# Patient Record
Sex: Female | Born: 1935 | Race: White | Hispanic: No | Smoking: Never smoker
Health system: Southern US, Community
[De-identification: ages and names within clinical notes are randomized; demographics above are authoritative.]

## PROBLEM LIST (undated history)

## (undated) DIAGNOSIS — J40 Bronchitis, not specified as acute or chronic: Secondary | ICD-10-CM

## (undated) DIAGNOSIS — I10 Essential (primary) hypertension: Secondary | ICD-10-CM

## (undated) DIAGNOSIS — J449 Chronic obstructive pulmonary disease, unspecified: Secondary | ICD-10-CM

## (undated) DIAGNOSIS — F0390 Unspecified dementia without behavioral disturbance: Secondary | ICD-10-CM

## (undated) DIAGNOSIS — C801 Malignant (primary) neoplasm, unspecified: Secondary | ICD-10-CM

## (undated) DIAGNOSIS — N390 Urinary tract infection, site not specified: Secondary | ICD-10-CM

## (undated) DIAGNOSIS — E7800 Pure hypercholesterolemia, unspecified: Secondary | ICD-10-CM

## (undated) DIAGNOSIS — I4891 Unspecified atrial fibrillation: Secondary | ICD-10-CM

## (undated) DIAGNOSIS — R011 Cardiac murmur, unspecified: Secondary | ICD-10-CM

## (undated) DIAGNOSIS — J189 Pneumonia, unspecified organism: Secondary | ICD-10-CM

## (undated) HISTORY — PX: COLON RESECTION: SHX5231

## (undated) HISTORY — PX: COLON SURGERY: SHX602

## (undated) HISTORY — PX: CATARACT EXTRACTION: SUR2

---

## 2002-03-29 ENCOUNTER — Encounter: Payer: Self-pay | Admitting: *Deleted

## 2002-03-29 ENCOUNTER — Observation Stay (HOSPITAL_COMMUNITY): Admission: EM | Admit: 2002-03-29 | Discharge: 2002-03-31 | Payer: Self-pay | Admitting: Emergency Medicine

## 2002-04-13 ENCOUNTER — Encounter: Payer: Self-pay | Admitting: Internal Medicine

## 2002-04-13 ENCOUNTER — Inpatient Hospital Stay (HOSPITAL_COMMUNITY): Admission: EM | Admit: 2002-04-13 | Discharge: 2002-04-18 | Payer: Self-pay | Admitting: *Deleted

## 2002-08-31 ENCOUNTER — Encounter: Payer: Self-pay | Admitting: Emergency Medicine

## 2002-08-31 ENCOUNTER — Emergency Department (HOSPITAL_COMMUNITY): Admission: EM | Admit: 2002-08-31 | Discharge: 2002-08-31 | Payer: Self-pay | Admitting: Emergency Medicine

## 2004-01-25 ENCOUNTER — Emergency Department (HOSPITAL_COMMUNITY): Admission: EM | Admit: 2004-01-25 | Discharge: 2004-01-25 | Payer: Self-pay | Admitting: Emergency Medicine

## 2004-01-29 ENCOUNTER — Inpatient Hospital Stay (HOSPITAL_COMMUNITY): Admission: EM | Admit: 2004-01-29 | Discharge: 2004-02-02 | Payer: Self-pay | Admitting: Emergency Medicine

## 2005-01-22 DIAGNOSIS — J40 Bronchitis, not specified as acute or chronic: Secondary | ICD-10-CM

## 2005-01-22 HISTORY — DX: Bronchitis, not specified as acute or chronic: J40

## 2005-08-03 ENCOUNTER — Emergency Department (HOSPITAL_COMMUNITY): Admission: EM | Admit: 2005-08-03 | Discharge: 2005-08-03 | Payer: Self-pay | Admitting: Emergency Medicine

## 2006-06-07 ENCOUNTER — Inpatient Hospital Stay (HOSPITAL_COMMUNITY): Admission: EM | Admit: 2006-06-07 | Discharge: 2006-07-03 | Payer: Self-pay | Admitting: Emergency Medicine

## 2006-06-07 ENCOUNTER — Ambulatory Visit: Payer: Self-pay | Admitting: Pulmonary Disease

## 2006-06-10 ENCOUNTER — Encounter (INDEPENDENT_AMBULATORY_CARE_PROVIDER_SITE_OTHER): Payer: Self-pay | Admitting: Surgery

## 2006-09-10 ENCOUNTER — Ambulatory Visit: Payer: Self-pay | Admitting: Oncology

## 2006-10-16 LAB — BASIC METABOLIC PANEL
CO2: 25 mEq/L (ref 19–32)
Glucose, Bld: 231 mg/dL — ABNORMAL HIGH (ref 70–99)
Potassium: 4.5 mEq/L (ref 3.5–5.3)
Sodium: 138 mEq/L (ref 135–145)

## 2006-10-16 LAB — CBC & DIFF AND RETIC
Eosinophils Absolute: 0 10*3/uL (ref 0.0–0.5)
HCT: 28.5 % — ABNORMAL LOW (ref 34.8–46.6)
IRF: 0.41 — ABNORMAL HIGH (ref 0.130–0.330)
LYMPH%: 14.6 % (ref 14.0–48.0)
MONO#: 0.2 10*3/uL (ref 0.1–0.9)
NEUT#: 2.9 10*3/uL (ref 1.5–6.5)
NEUT%: 78.4 % — ABNORMAL HIGH (ref 39.6–76.8)
Platelets: 255 10*3/uL (ref 145–400)
Retic %: 2.8 % — ABNORMAL HIGH (ref 0.4–2.3)
WBC: 3.7 10*3/uL — ABNORMAL LOW (ref 3.9–10.0)

## 2006-10-21 LAB — IMMUNOFIXATION ELECTROPHORESIS: IgA: 379 mg/dL — ABNORMAL HIGH (ref 68–378)

## 2006-10-21 LAB — FERRITIN: Ferritin: 441 ng/mL — ABNORMAL HIGH (ref 10–291)

## 2006-10-21 LAB — IRON AND TIBC
%SAT: 22 % (ref 20–55)
Iron: 59 ug/dL (ref 42–145)
TIBC: 269 ug/dL (ref 250–470)

## 2006-11-04 ENCOUNTER — Ambulatory Visit: Payer: Self-pay | Admitting: Oncology

## 2009-10-14 ENCOUNTER — Emergency Department (HOSPITAL_COMMUNITY)
Admission: EM | Admit: 2009-10-14 | Discharge: 2009-10-14 | Payer: Self-pay | Source: Home / Self Care | Admitting: Emergency Medicine

## 2010-04-06 LAB — DIFFERENTIAL
Basophils Absolute: 0 10*3/uL (ref 0.0–0.1)
Basophils Relative: 1 % (ref 0–1)
Eosinophils Absolute: 0.1 10*3/uL (ref 0.0–0.7)
Eosinophils Relative: 2 % (ref 0–5)

## 2010-04-06 LAB — PROTIME-INR: Prothrombin Time: 20.6 seconds — ABNORMAL HIGH (ref 11.6–15.2)

## 2010-04-06 LAB — CBC
MCH: 30.3 pg (ref 26.0–34.0)
MCV: 87.6 fL (ref 78.0–100.0)
Platelets: 161 10*3/uL (ref 150–400)
RDW: 15.8 % — ABNORMAL HIGH (ref 11.5–15.5)
WBC: 3.8 10*3/uL — ABNORMAL LOW (ref 4.0–10.5)

## 2010-04-06 LAB — BASIC METABOLIC PANEL
BUN: 24 mg/dL — ABNORMAL HIGH (ref 6–23)
Chloride: 104 mEq/L (ref 96–112)
Creatinine, Ser: 1.32 mg/dL — ABNORMAL HIGH (ref 0.4–1.2)

## 2010-04-06 LAB — BRAIN NATRIURETIC PEPTIDE: Pro B Natriuretic peptide (BNP): 241 pg/mL — ABNORMAL HIGH (ref 0.0–100.0)

## 2010-06-06 NOTE — Discharge Summary (Signed)
NAMEGRACE, VALLEY               ACCOUNT NO.:  [id number]   MEDICAL RECORD NO.:  [id number]          PATIENT TYPE:  INP   LOCATION:  3701                         FACILITY:  [facility]   PHYSICIAN:  Kela Millin, M.D.DATE OF BIRTH:  1910/05/17   DATE OF PROCEDURE:  DATE OF DISCHARGE:                    STAT - MUST CHANGE TO CORRECT WORK TYPE   STAT DISCHARGE SUMMARY   Addendum to the Discharge Summary dictated on June 25, 2006, by Dr. Doristine Counter.   ADDENDUM TO HOSPITAL COURSE BY PROBLEM LIST:  1. E. coli and Klebsiella oxytoca urinary tract infection - the      patient had a urinalysis done on June 03 while in the hospital and      the urinalysis was consistent with a urine infection.  She was      empirically started on Cipro and the urine cultures subsequently      grew Klebsiella oxytoca and E. coli, which were sensitive to Cipro      (only resistant to Cefazolin and Ampicillin).  The patient has been      on Cipro and has remained hemodynamically stable and afebrile with      no leukocytosis.  She will be discharged on oral Cipro to complete      the antibiotic course on July 06, 2006.  2. Probable right renal complex cyst, 5.3 cm - this was first noted on      CT scan done upon admission, and the patient subsequently had a      renal ultrasound as was recommended to further characterize on Jun 13, 2006, and it was read per Radiology as a right-sided renal cyst      without hydronephrosis.  3. Chronic atrial fibrillation - Cardiology followed the patient in      the hospital for rate control, she was kept off the Cardia that she      had been on initially, and her Metoprolol titrated to 75 mg p.o.      b.i.d. and she is to continue this upon discharge.  The patient had      a GI bleed while in the hospital and so the Coumadin was held until      the bleeding had resolved and her hemoglobin remained stable.  I      discussed restarting her Coumadin with  Gastroenterology, and they      stated that the family had indicated that they desired empiric      treatment without endoscopy and so the source of the bleeding was      unclear and recommended that the risks and benefits of restarting      the Coumadin be discussed with the family and if they decided to      have it restarted to do so.  I talked with patient and her      daughter/family and they voiced understanding of the risks and the      benefits, and after thinking about it for a few days they decided      to have the Coumadin restarted and this was done.  Her PT-INR has      been monitored in the hospital, and her INR today at the time of      this dictation is 1.2 and her hemoglobin is stable at 12.1 and 36.      Her Coumadin is to be continued upon discharge with monitoring of      the PT-INR and her dose adjusted as appropriate, her H&H also to be      monitored upon discharge.  The patient is also to be maintained on      a PPI upon discharge.   DISCHARGE MEDICATIONS:  1. Cipro 500 mg p.o. daily through July 06, 2006.  2. Glyburide 5 mg p.o. b.i.d.  3. Metoprolol 75 mg p.o. b.i.d.  4. Sliding scale insulin.  5. Tessalon Perls 100 mg p.o. t.i.d. p.r.n. cough.  6. Dulcolax 10 mg daily p.r.n. constipation.  7. Citrucel one pack p.o. b.i.d.  8. Protonix 40 mg p.o. daily.  9. Tylenol 650 mg p.o. q.4-6h p.r.n.  10.Coumadin 5 mg p.o. daily; PT-INR to be checked the day following      admission to the nursing home and the Coumadin dose to be adjusted      per nursing home physician as appropriate.  11.The patient's Lasix was held during her hospital stay secondary to      worsening of her renal function, she is to be monitored and the      Lasix restarted per nursing home physician when appropriate.  12.Diovan discontinued in the hospital, also Cartia-XT discontinued in      the hospital.   DISCHARGE DIET:  Following a speech therapy reevaluation of the  patient's swallowing,  she was advanced to a dysphagia-2 diet with thin  liquids, no straws, small bites/sips, and the patient to swallow two  times with all bites/sips, and this to be continued at the skilled  nursing facility.   Speech Therapy to follow patient at skilled nursing, also PT and OT.   The patient to follow up with nursing home physician in one to two days.   Follow up with Dr. Duane Lope as scheduled.   Follow up with [facility] - call for appointment.      Kela Millin, M.D.  Electronically Signed     ACV/MEDQ  D:  07/02/2006  T:  07/02/2006  Job:  [id number]   cc:   C. Duane Lope, M.D.

## 2010-06-06 NOTE — H&P (Signed)
[name], [name]:  [id number]   MEDICAL RECORD NO.:  [id number]          PATIENT TYPE:  INP   LOCATION:  1830                         FACILITY:  [facility]   PHYSICIAN:  Andres Shad. Rudean Curt, MD     DATE OF BIRTH:  09-Nov-1910   DATE OF ADMISSION:  06/07/2006  DATE OF DISCHARGE:                              HISTORY & PHYSICAL   STAT HISTORY AND PHYSICAL   CHIEF COMPLAINT:  Vomiting and epigastric pain.   HISTORY OF PRESENT ILLNESS:  Mrs. [name] is a 77 year old woman with a  past medical history notable for hypertension, diabetes mellitus, and a  remote partial colectomy for colon cancer.  She presented to the  emergency department today with pain in her abdomen that began last  night.  This was accompanied by an episode of vomiting of brownish  liquid last night and then again this morning.  The pain was sharp in  nature and mainly affected her right upper quadrant and epigastric area.   REVIEW OF SYSTEMS:  Negative for cough, negative for shortness of  breath, negative for chest pain, negative for radiation, negative for  fever, negative for chills, negative for diarrhea, negative for  diaphoresis.   PAST MEDICAL HISTORY:  1. Type 2 diabetes mellitus.  2. Hypertension.  3. Atrial fibrillation.  4. Colon cancer diagnosed many years ago treated with a partial      colectomy.   HOME MEDICATIONS:  1. Cartia-XT 120 mg daily.  2. Coumadin 5 mg daily.  3. Diovan 160 mg daily.  4. Glyburide 5 mg twice daily.  5. Lasix 40 mg daily.  6. Potassium chloride 10 mEq twice daily.  7. Toprol-XL 25 mg daily.   ALLERGIES:  None.   SOCIAL HISTORY:  The patient has numerous family members in the area.  She does not smoke.  She drinks one cup of coffee a day.  The family is  [place], and she frequently has tomatoes and spicy foods.   PHYSICAL EXAMINATION:  VITAL SIGNS:  Temperature 98.2, heart rate 86 to  120, blood pressure 134/63, respiratory rate 22, oxygen  saturation 94%  on 2 liters of oxygen.  GENERAL APPEARANCE:  The patient was an alert, elderly female, who was  very hard-of-hearing but pleasant and oriented and in no acute distress.  HEENT:  Mucous membranes were moist.  NECK:  There was no JVD.  There was no thyromegaly.  LUNGS:  Clear to auscultation bilaterally.  CARDIOVASCULAR:  Irregular, normal S1 and S2, a soft murmur was heard in  the precordium.  ABDOMEN:  Normal bowel sounds, soft.  There was mild tenderness in the  epigastric area, no organomegaly.  There was a firm abdominal wall  hernia in the left lower quadrant that the family states is not new.  EXTREMITIES:  Warm, well-perfused.  There was pitting edema to the mid  shin.   LABORATORY STUDIES:  CBC was within normal limits.  Hemoglobin was 14.  White blood cell count 9.  Electrolytes were within normal limits.  Creatinine was 1.43.  Her last previous creatinine was 1.3 approximately  10 months ago.  Total bilirubin was elevated at 1.9.  AST and ALT were  normal.  Lipase was normal.  CK-MB was 1.0.  The troponin was 0.06 at  1:19 p.m. and 0.08 at 3:24 p.m.  A D-dimer was 0.27.  EKG showed atrial  fibrillation with rate of 88 beats per minute.  There was a left bundle  branch block.  The bundle branch block was also seen on an EKG performed  in 2004; however, the atrial fibrillation was not seen on that EKG.  A  chest x-ray was essentially unremarkable.  CT scan of the abdomen and  pelvis was performed.  These were notable for the following findings:  1)  Partial collapse and consolidation over the right lower lobe; 2)  A  5.3 cm lesion in the right kidney thought to be a complex cyst; 3)  Cholelithiasis; 4)  Edema in the wall of the distal esophagus; 5)  Proximal small bowel dilatation up to 4.1 cm; 6)  A complex ventral  hernia was seen in the lower abdominal wall.   ASSESSMENT AND PLAN:  Chest and abdominal pain.  It is unclear what the  primary etiology is, and  there are several possible explanations.  The  patient is at some risk for coronary artery disease with hypertension  and diabetes mellitus as well as her age.  Her cardiac enzymes are  slightly abnormal, and her electrocardiogram is difficult to interpret  with her bundle branch block.  The patient also has edema in the distal  esophagus, which may suggest esophagitis possibly due to  gastroesophageal reflux.  The family also notes that the patient has  been exerting herself somewhat lifting heavy things around the house and  thinks that there may be a component of muscle soreness.  The patient  also has cholelithiasis seen on her computerized tomography scan of the  abdomen so biliary colic is a possibility.  There is no evidence,  however, of cholecystitis.  Finally, the collapse and consolidation seen  in the right lower lobe is as of yet unexplained, but clinically the  patient does not seem to have pneumonia; however, some other primary  etiology such as an endobronchial lesion cannot be excluded based on  this study alone.   Due to the patient's advanced age and her otherwise good quality of  life, it seems that the family is not interested in aggressively  pursuing potential primary diagnoses with invasive testing.  They do  understand, however, that the lesion in the esophagus, the collapse in  the right lower lobe, and the lesion in the kidney could all represent  signs of malignancy.   PLAN:  1. I will admit the patient for observation to rule out a myocardial      infarction.  Serial cardiac enzymes and a repeat electrocardiogram      will be performed.  She will be treated with aspirin.  She is      already taking a beta blocker.  2. I will order a renal ultrasound to further evaluate the cystic      lesion in her kidney.  3. I will begin a proton pump inhibitor, Protonix 40 mg twice daily,      to see if this helps resolve any reflux      esophagitis. 4. I will  recommend a repeat CT scan of the abdomen in one to two      months following discharge to see if there has been  any progression      of the abnormalities noted.      Andres Shad. Rudean Curt, MD  Electronically Signed     PML/MEDQ  D:  06/07/2006  T:  06/07/2006  Job:  [id number]   cc:   Miguel Aschoff, M.D.

## 2010-06-06 NOTE — Consult Note (Signed)
NAMETECLA, MAILLOUX NO.:  [id number]   MEDICAL RECORD NO.:  [id number]          PATIENT TYPE:  INP   LOCATION:  2911                         FACILITY:  [facility]   PHYSICIAN:  Graylin Shiver, M.D.   DATE OF BIRTH:  August 27, 1910   DATE OF CONSULTATION:  06/22/2006  DATE OF DISCHARGE:                                 CONSULTATION   REASON FOR CONSULTATION:  The patient is a 77 year old female. GI  consult was requested because of melena and anemia. The patient was  admitted to the hospital on Jun 05, 2006, with epigastric pain and  vomiting brownish liquid. There is no history of peptic ulcer disease.  She had been on Coumadin.  There is a remote history of partial  colectomy for colon cancer. While here in the hospital, the patient had  surgery for a strangulated ventral wall incisional hernia and also 90 cm  of ischemic small bowel was resected. This was on Jun 10, 2006.  According to the daughter, melena began yesterday. Her hemoglobin  yesterday was 9.5, hemoglobin today is 8.1.   MEDICAL HISTORY:  History of diabetes mellitus, hypertension, atrial  fibrillation, and colon cancer in the past.   ALLERGIES:  None known.   CURRENT MEDICATIONS:  Noted on medication sheet.   REVIEW OF SYSTEMS:  Not complaining of chest pain or shortness of  breath.   PHYSICAL EXAMINATION:  GENERAL:  She is in no distress, nonicteric.  HEART:  Regular rhythm.  No murmurs.  LUNGS:  Clear.   ABDOMEN:  Soft, nontender, no hepatosplenomegaly.   IMPRESSION:  1. Melena.  2. Anemia.   COMMENT:  Melena could be coming from the stomach or the duodenum.  She  did have what sounds like coffee ground emesis when she was admitted to  the hospital. It is also possible that there could be some slow oozing  from the small bowel anastomosis.   I discussed all this with her daughter. Given the patient's age and  general medical condition, the daughter prefers conservative approach  for now  with following of H&H and blood transfusion as needed. If  bleeding continues and her H&H drop and further blood transfusions are  needed, then we will consider further evaluation.  Most likely, the next  step would be EGD. She will remain on Protonix and avoid Coumadin at  this time.           ______________________________  Graylin Shiver, M.D.     SFG/MEDQ  D:  06/22/2006  T:  06/22/2006  Job:  [id number]   cc:   Corinna L. Lendell Caprice, MD  Andres Shad. Rudean Curt, MD  Ardeth Sportsman, MD  C. Duane Lope, M.D.

## 2010-06-06 NOTE — Op Note (Signed)
NAMEJENNESSY, SANDRIDGE NO.:  [id number]   MEDICAL RECORD NO.:  [id number]          PATIENT TYPE:  INP   LOCATION:  2109                         FACILITY:  [facility]   PHYSICIAN:  Ardeth Sportsman, MD     DATE OF BIRTH:  [birth year]   DATE OF PROCEDURE:  DATE OF DISCHARGE:                               OPERATIVE REPORT   PRIMARY CARE PHYSICIAN:  Andres Shad. Rudean Curt, MD.   CARDIOLOGIST:  Armanda Magic, M.D. with [facility].   SURGEON:  Ardeth Sportsman, MD   ASSISTANT:  Adolph Pollack, M.D.   PREOPERATIVE DIAGNOSIS:  Strangulated ventral wall incisional hernia.   POSTOPERATIVE DIAGNOSES:  Strangulated ventral wall incisional hernia.   PROCEDURE PERFORMED:  1. Exploratory laparotomy.  2. Lysis of adhesions x120 minutes.  3. Small bowel resection of about 90 cm of severely ischemic and      necrotic small intestine with primary anastomosis.  4. Primary ventral hernia repair of 12 x 15 cm defect.   ANESTHESIA:  General anesthesia.   SPECIMENS:  Ninety centimeters of necrotic and ischemic bowel.   DRAINS:  He has a 15-French drain that is in a large subcutaneous hernia  sac primarily in her left lower quadrant.   ESTIMATED BLOOD LOSS:  Around 200 mL.   COMPLICATIONS:  No immediate complications.   INDICATIONS:  Ms. Stehr is a 77 year old female who has had a chronic  abdominal wall hernia that sounds like it is chronically incarcerated  but is usually not a major problem.  She came with abdominal pain and  findings of bowel obstruction.  CT scan showed transition zone in her  ventral hernia.  When I was consulted, she already was having severe  pain and tenderness there and had significant skin changes and redness  with a left shift on white count.   Options were discussed with the family, particularly grandson, and the  feeling was that she probably had a strangulated ventral hernia with  severely ischemic if not necrotic bowel and if we do not operate  on her,  she would die.  Concern is given her age and other issues that her  operative risks for stroke, heart attack, deep venous process, pulmonary  process and death were high.  Cardiology did an echocardiogram on her  and she had an ejection fraction of 60% and felt she was a reasonable  operative candidate.  Her INR was 4 and she has gotten 5 units of FFP  prior to surgery.   Risks such as bleeding, need for transfusion, wound infection, abscess,  wound dehiscence, need for wound care, injury to other organs,  anastomotic leak, possible ostomy and other risks were discussed.  Questions were answered and  they agreed to proceed.   OPERATIVE FINDINGS:  As noted above was a chronic hernia sac with  incarcerated omentum and large volume of small bowel within it with  patches of necrosis.  She also had dense intraabdominal adhesions of  omentum and at least 2 loops that went straight down to the  retroperitoneum with one area causing probably partial obstruction  as  well.   DESCRIPTION OF PROCEDURE:  Informed consent was confirmed.  The patient  was already on IV antibiotics.  She had a Foley catheter already placed.  She underwent general anesthesia without difficulty.  She was positioned  supine with arms out.  Her abdomen was prepped and draped in a sterile  fashion.   Entry was gained in the abdomen through her prior midline incision,  carried primarily infraumbilically.  Within 5 mm got into a large hernia  sac.  Initially the small bowel seemed dilated but viable, but after  extensive adhesiolysis, came across more necrotic loops of bowel more  laterally within the large hernia sac involving pretty much her entire  left lower quadrant.  She ended up having about a 12 x 15 cm defect but  there were numerous bridging areas of fascia and dense adhesion and  omentum that I think caused a smaller, more Swiss cheese appearance and  hence strangulation.  Extensive adhesiolysis was  performed to help free  the omentum from its dense adhesions to the anterior abdominal wall and  help free the small bowel from its attachments to the fascial edges as  well as retroperitoneum.  Ultimately with dissection we could see she  had patches of significant necrosis as well as severe ischemia with  paper-thin small bowel.  There actually were a couple of enterotomies  that were performed that were controlled with a stitch of silk.  Ultimately, we were able to free up and picked about a yard of small  bowel that was severely ischemic and paper thin or necrotic including  the areas where enterotomies were performed.  Ultimately this was  transected using GIA stapler and mesentery was taken using LigaSure to  good result.  A side-to-side staple anastomosis was performed and the  staple defect was closed using 3-0 silk interrupted stitches to good  effect.  Mesenteric defect was closed with 3-0 silk figure-of-8 stitches  as well.  Anastomosis was inspected.  The proximal was a little bit  somewhat dilated and ischemic but seemed to pink up  by the end of the  case.   Copious irrigation of over 8 liters was done to ultimately nice clear  return with no significant definite abscess anywhere.  Omentum was laid  over the wound and the wound was closed using #1 Novofil figure-of-8 in  interrupted stitches.  The fascia actually did come together relatively  well, so given the severe contamination of feculent succus in the hernia  sac as well as the abdominal cavity, and we elected not to do any  biologic mesh at this time.  It seemed to come together pretty well  without significant tension.  Hernia sac was debrided out as much as  possible until there was more healthy tissue on the fascia and  subcutaneous tissues.  A 15-French Blake drain was placed through a stab  incision out the right side of the abdomen and curled within a large left hernia sac.  The subcutaneous wound was irrigated  with over a liter  of saline to good clear return and tissues were viable.  The skin was  loosely closed using staples over the drain.   I explained the operative findings to the patient's grandson.  I made an  attempt to talk to the patient's daughter, but I was not able to reach  her from home.  Postoperative recovery issues were discussed in detail  and he expressed understanding and appreciation.  I  have talked to  critical care medicine who helped follow the patient with [place], Dr. Delton Coombes  with [facility]  from the vent standpoint.      Ardeth Sportsman, MD  Electronically Signed     SCG/MEDQ  D:  06/10/2006  T:  06/10/2006  Job:  [id number]

## 2010-06-06 NOTE — Discharge Summary (Signed)
[name], Mcmahon NO.:  [id number]   MEDICAL RECORD NO.:  [id number]          PATIENT TYPE:  INP   LOCATION:  2921                         FACILITY:  [facility]   PHYSICIAN:  Andres Shad. Rudean Curt, MD     DATE OF BIRTH:  24-Oct-1910   DATE OF ADMISSION:  06/07/2006  DATE OF DISCHARGE:                               DISCHARGE SUMMARY   INTERIM DISCHARGE SUMMARY:   DIAGNOSES:  1. Incarcerated ventral hernia, status post resection of necrotic      bowel and hernia repair.  2. Ventilatory-dependent respiratory failure and septic shock, now      resolved.  3. Acute renal failure, resolving.  4. Gastrointestinal bleed.  5. Atrial fibrillation with rapid ventricular response.  6. Peripheral edema.  7. Dysphagia.   SUMMARY OF HOSPITALIZATION:  [name] is a 77 year old woman with a  past medical history notable for diabetes mellitus and hypertension, who  presented to the hospital on Jun 07, 2006, complaining of vomiting,  nausea, epigastric pain and chest pain.  She was initially worked up for  a myocardial ischemia because of abnormal cardiac enzymes; however, over  the subsequent 2 days in the hospital it became apparent that she was  suffering from a small bowel obstruction because of an incarcerated  ventral hernia.  This hernia had been identified long before but it had  never been a problem until this admission.  She was taken emergently to  the operating room on Jun 10, 2006.  The details of this procedure can  be found in the note dictated on that date by Dr. Michaell Cowing from surgery.  Following the surgery the patient developed septic shock with  ventilatory-dependent respiratory failure.  She was admitted to the  intensive care unit, where she required ventilatory support, sedation  and vasopressors as well as broad-spectrum antibiotics.  This course was  complicated by acute renal failure with a serum creatinine that peaked  at approximately 4.3 over her baseline  of 1.4.  However, the patient  successfully weaned from the ventilator and off of pressors and was  transferred to the stepdown unit.   Since discharge from the intensive care unit the patient's hospital  course has been complicated by uremia with persistent BUNs over 100.  This has only begun to decrease over the last few days.  It seems that  this has been a combination of a side effect from her total peripheral  nutrition, which was discontinued several days ago, as well as a GI  bleed that developed several days ago as well.  The patient had an  episode of vasovagal syncope associated with melanotic stool and a drop  in her hematocrit.  This occurred Friday, May 30.  Gastroenterology was  consulted and it was opted to manage this conservatively and watch the  patient and her hematocrit as it remained stable following 3 units of  transfused blood.  Additionally, the patient's course has been  complicated by tachycardia.  She has a wide complex rhythm at baseline  because of a left bundle branch block.  She is mostly in  atrial  fibrillation but sometimes in sinus rhythm.  This has been followed by  [facility], and the patient has been managed with beta blockers.  She is currently on Lopressor at 75 mg twice daily.   DISPOSITION:  The patient still requires hospitalization because of  deconditioning, tachycardia and incompletely-resolved renal failure;  however, it is anticipated that she could be discharged to a skilled  nursing facility for rehabilitation within the next week.  To this end,  I am requesting that social work become involved to explore bed  placement at this time.      Andres Shad. Rudean Curt, MD  Electronically Signed     PML/MEDQ  D:  06/25/2006  T:  06/25/2006  Job:  [id number]   cc:   C. Duane Lope, M.D.

## 2010-06-06 NOTE — Consult Note (Signed)
NAMEKAYTELYN, GLORE NO.:  [id number]   MEDICAL RECORD NO.:  [id number]          PATIENT TYPE:  INP   LOCATION:  3737                         FACILITY:  [facility]   PHYSICIAN:  Peter M. Swaziland, M.D.  DATE OF BIRTH:  08/19/1910   DATE OF CONSULTATION:  06/08/2006  DATE OF DISCHARGE:                                 CONSULTATION   HISTORY OF PRESENT ILLNESS:  Mrs Collignon is a 77 year old white female  who is evaluated for elevated troponin.  She was admitted last night  with multiple nonspecific complaints.  She is very difficult historian.  She complains of weakness, poor appetite and nausea, vomiting, some  abdominal pain as well as pain in her mid back radiating around her  ribs.  She is had no anterior chest pain or any shortness of breath.  She does have a history of chronic atrial fibrillation, and has been on  Coumadin therapy.  She is followed by Dr. Mayford Knife.  She denies any  history of angina, myocardial infarction, or congestive heart failure.  She does have a history of diabetes and hypertension.   PAST MEDICAL HISTORY:  1. Atrial fibrillation.  2. Diabetes mellitus.  3. Hypertension.  4. Coumadin therapy.  5. COPD.  6. History of TIAs.   ALLERGIES:  She is allergic to CODEINE which causes confusion.   CURRENT MEDICATIONS:  1. Potassium 20 mEq per day.  2. Furosemide 40 mg per day.  3. Lanoxin 0.125 mg Monday, Wednesday and Friday.  4. Glyburide 5 mg daily.  5. Diovan 160 mg daily.  6. Cardia XT 120 mg daily.  7. Coumadin, unknown dose.  8. Metoprolol 25 mg per day.   SOCIAL HISTORY:  Patient is a nonsmoker, nondrinker.   FAMILY HISTORY:  Noncontributory.   REVIEW OF SYSTEMS:  Very difficult to obtain.   PHYSICAL EXAMINATION:  GENERAL:  She is an elderly white female in no  apparent distress.  VITAL SIGNS:  Blood pressure 142/65, pulse 75 and irregular, oxygen  saturation 95% on 2 L nasal cannula.  Temperature is 100.5.  HEENT:  The  patient is normocephalic, atraumatic.  Pupils equal, round, reactive to  light and accommodation.  Extraocular movements are full.  Oropharynx is  clear.  NECK:  Supple without JVD, adenopathy, thyromegaly, or bruits.  LUNGS:  Clear to auscultation and percussion.  CARDIAC:  Irregular rate and rhythm without gallop, murmur, rub, or  click.  ABDOMEN:  Soft, nontender.  Bowel sounds are positive.  EXTREMITIES:  Without significant edema.  Pulses are palpable.   LABORATORY DATA:  Acute abdominal series showed cardiomegaly with a  small right effusion/atelectasis.  There is a nonspecific bowel gas  pattern.  There are degenerative changes in the lumbar spine.  CT of  abdomen and pelvis showed gallstones.  The patient had a complex ventral  hernia with evidence of some small bowel obstruction proximal to the  hernia which contain loops of small bowel.  There is also right lower  lobe consolidation.  She had a renal ultrasound which showed a 5.1 cm  simple cyst.  There  is no hydronephrosis.  Electrocardiogram showed  atrial fibrillation, left bundle branch block.  White count 11,900,  hemoglobin 13.2, hematocrit 38.7, platelets 174,000.  Sodium 132,  potassium 4.1, chloride 96, CO2 28, BUN 30, creatinine 1.48, glucose of  202.  LFTs were normal.  Digoxin levels less than 0.2.  D-dimers 0.27.  Protime was 25.6 with an INR of 2.2.  CPKs were all normal with  beginning at 34 with 1.8 MB, then 44 with 1.7 MB, then 36 with 1.9 MB.  Troponins were 0.06, 0.08, .11, and 0.14.   IMPRESSION:  1. Elevated troponins in patient with multiple medical issues.  She      has no specific symptoms of angina.  Her ECG is uninterpretable due      to her left bundle branch block.  However, given her normal CPKs      and her other medical issues,  I think this probably does not      represent an acute coronary syndrome.  Troponins may be elevated      due to her renal insufficiency.  Her clinical presentation is  more      consistent with a partial small bowel obstruction.  2. Partial small bowel obstruction.  3. Atrial fibrillation, rate control.  4. Diabetes mellitus.  5. Hypertension.  6. Chronic anticoagulation.   PLAN:  Would make no change in her current conservative therapy at this  time.  I will notify Dr. Mayford Knife of the patient's admission.           ______________________________  Peter M. [place]il[place], M.D.     PMJ/MEDQ  D:  06/08/2006  T:  06/09/2006  Job:  [id number]   cc:   Dellis Anes. Idell Pickles, M.D.  Armanda Magic, M.D.

## 2010-06-06 NOTE — Discharge Summary (Signed)
[name], [name] NO.:  [id number]   MEDICAL RECORD NO.:  [id number]          PATIENT TYPE:  INP   LOCATION:  3737                         FACILITY:  [facility]   PHYSICIAN:  Ardeth Sportsman, MD     DATE OF BIRTH:  [birth year]   DATE OF ADMISSION:  06/07/2006  DATE OF DISCHARGE:                               DISCHARGE SUMMARY   PRIMARY CARE PHYSICIAN:  I'm exactly certain, but I think it may be Dr.  Rudean Curt.   REASON FOR ADMISSION:  Turner with [facility].   SURGEON:  Ardeth Sportsman, MD.   REASON FOR CONSULTATION:  Strangulated abdominal wall incisional hernia.   HISTORY OF PRESENT ILLNESS:  [name] is a 77 year old female who  has had a history of prior open cholecystectomy.  She has had what  sounds like a chronic ventral hernia for some time.  Usually it is mild  and only mildly annoying.  She got admitted two days over concern of  some abdominal and back pain and some nausea.  She has slightly elevated  troponins, and they are concerned about her having a myocardial  infarction.  Cardiology was consulted, and they said that she had no  evidence of any myocardial infarction.  Due to the abdominal pain, a CT  scan was ordered, and she was found to have evidence of at least a  partial small bowel obstruction with bowel incarcerated within a complex  ventral hernia primarily involving the left lower quadrant.   The patient notes she is having worsening abdominal pain today.  She has  had nausea, and she is throwing up.  She does have some appetite  however.  The pain is markedly increased.  She has developed some  erythema.  Cardiology called me with concerns of a possible surgical  emergency.  She is normally on Coumadin for chronic atrial fibrillation  , and INR is currently 4.   PAST MEDICAL HISTORY:  1. Atrial fibrillation.  2. Diabetes.  3. Hypertension.  4. COPD.  5. History of transient ischemic attacks.   PAST SURGICAL HISTORY:  She  has had an open cholecystectomy in the past.  She thinks she has had a pelvic surgery but is not certain.   SOCIAL HISTORY:  No tobacco, alcohol, or drug use.  She lives with her  daughter in [place] and her grandchildren.  Her daughter currently is  up in [place] for family reasons but is coming back today.   FAMILY HISTORY:  Negative for any major GI problems or other concerns.   ALLERGIES:  CODEINE MAKES HER CONFUSED.   MEDICATIONS:  1. She takes potassium, furosemide, Lanoxin, Glyburide, Diovan,      Cartia, Coumadin, and metoprolol.  Her Coumadin is was held      starting yesterday.   REVIEW OF SYSTEMS:  She is not the greatest historian in the world, but  she is having a little bit of chills but no definite shakes.  No change  in her weight.  She has very poor vision, but that is relatively stable  and no other  issues.  ENT:  Has been relatively negative. CARDIAC:  She  denies really any chest pain or any pain related to exertion.  No jaw or  arm pain or soreness or numbness.  CHEST:  She has some pain to deep  breath, but it is relatively mild.  No recent colds or flu's, otherwise  negative.  GU:  No hematemesis, melena, or hematochezia.  I do not know  what her colonoscopy status is.  ENDOCRINE:  She is diabetic, but she is  controlled on oral hypoglycemics only at this time.  RENAL:  I think she  has a little bit of renal insufficiency but otherwise no major issues  there.  MUSCULOSKELETAL:  She has some mild focal lumbosacral spinal  degenerative disease and a little bit of problem with walking but no  major issues.  NEUROLOGICAL:  Negative.  PSYCHIATRIC:  She occasionally  gets confused but no strong evidence of definite dementia, according to  the patient.   LABORATORY VALUES:  Her white count is 11.9, but she has a strong left  shift.  Her platelets are down to 174.  Her potassium is 4.1, creatinine  1.4, BUN 3.  INR was 2.2 yesterday, today it is 4, and that is  after  being held.  Bilirubin is 2.2.  Her CK-MBs are low, and her troponins  are 0.09 and have been slowly increasing up to 0.13.   PHYSICAL EXAMINATION:  VITAL SIGNS:  Her temperature maximum is 99.4,  pulse is 60s to 70s, respirations 18, blood pressure is in the 110s to  140s.  She has had two liters, is saturating 95%.  She had been eating.  She had a little bit of breakfast.  She is alert and oriented x4, in no acute distress.  PSYCHIATRIC:  She is pleasant, interactive, maybe with some mild  dementia, a little bit of confusion, but she has pretty good  recollection.  No evidence of any psychosis or paranoia.  HEENT:  Eyes:  Pupils equal, round, relative to light.  Extraocular  movements intact.  Nasopharynx and oropharynx clear.  She seems to be  normocephalic with no definite facial asymmetry.  Mucous membranes are  dry.  NECK:  Supple, no masses, trachea is midline.  HEART:  Irregularly irregular but there are no murmurs, gallops or rubs  of significance.  LUNGS:  Clear to auscultation bilaterally.  No wheezes, rales, or  rhonchi, some decreased breath sounds at the bases but no definite  crackles.  ABDOMEN:  Left lower quadrant has a very large tender mass.  I would say  it is about 20 x 20 x 15 cm in size minimally with a large patch of  erythema around it.  It is exquisitely tender to palpation.  She has a  low midline incision.  This mass seems to be focused in the left lower  quadrant.  The patient had a definite incision over there.  The rest of  her upper abdomen seems to be soft and not particularly bothersome.  GENITOURINARY:  Normal external female genitalia.  RECTAL:  Deferred at the patient's request.  EXTREMITIES:  No major clubbing or cyanosis.  MUSCULOSKELETAL:  Full range of motion of shoulders, arms, wrists,  knees, feet, and ankles.  She does have some degenerative joint disease  on her fingers however. LYMPHATICS:  No head, neck, axillary, or groin  lymphadenopathy.  SKIN:  No major petechia, purpura.  Erythema as noted above on the left  lower quadrant of  the abdomen.  No other source of lesions.   LABORATORY VALUES:  Again, her white count was 9.3 and has increased.  She has a left shift.  Her potassium is 4, creatinine is 1.4 as of  yesterday.  INR got as high as 4; it was 1.9 on admission.  CK-MB as  high as 0.14.   ASSESSMENT AND PLAN:  A 77 year old female with cardiac and pulmonary  issues with a probable strangulated ventral hernia.   Options were discussed, the anatomy and physiology, the malformation in  the digestive tract was explained.  Pathophysiology of abdominal  herniation was explained.  The risks of incarceration and its natural  history were explained.  At this point, she seems very tender, and I am  worried about infected bowel in there.  I think this is a surgical  emergency.  Fortunately, she is not in any major shock right now at this  moment.   Her options discussed, recommendation was made for exploration of the  abdomen with possible bowel resection and repair of ventral hernia.   The risks of stroke, heart attack, death, bleeding needing transfusion,  wound infection, abscess, injury of the organs, eed for ostomy, chronic  pain, ICU stay, and other risks were discussed.  She understands these  risks.  She agreed to proceed.  However, she wishes to try and talk to  her daughter who is currently flying back.  I will try and a hold of her  family at home.   I agree with Cardiology that she needs to have an echocardiogram done.  That is going to happen today.  I wrote for some stat FFP and vitamin K  to hopefully correct her INR.  Her bleeding risk is severely high if we  do not proceed with this now.  They expressed understanding and will  work to get it done.      Ardeth Sportsman, MD  Electronically Signed     SCG/MEDQ  D:  06/09/2006  T:  06/09/2006  Job:  [id number]

## 2010-11-09 LAB — DIFFERENTIAL
Basophils Absolute: 0
Eosinophils Relative: 1
Lymphocytes Relative: 7 — ABNORMAL LOW
Lymphs Abs: 0.4 — ABNORMAL LOW
Monocytes Absolute: 0.4
Neutro Abs: 5.1

## 2010-11-09 LAB — CBC
HCT: 33.6 — ABNORMAL LOW
HCT: 33.8 — ABNORMAL LOW
HCT: 33.8 — ABNORMAL LOW
HCT: 34.7 — ABNORMAL LOW
HCT: 35 — ABNORMAL LOW
HCT: 35.8 — ABNORMAL LOW
Hemoglobin: 11 — ABNORMAL LOW
Hemoglobin: 11.1 — ABNORMAL LOW
Hemoglobin: 11.4 — ABNORMAL LOW
Hemoglobin: 11.8 — ABNORMAL LOW
Hemoglobin: 11.9 — ABNORMAL LOW
Hemoglobin: 12.3
MCHC: 33.8
MCHC: 33.8
MCHC: 34
MCHC: 34.4
MCHC: 34.4
MCHC: 34.6
MCV: 85.2
MCV: 85.5
MCV: 86.2
Platelets: 134 — ABNORMAL LOW
Platelets: 144 — ABNORMAL LOW
Platelets: 147 — ABNORMAL LOW
Platelets: 210
Platelets: 287
RBC: 3.73 — ABNORMAL LOW
RBC: 3.86 — ABNORMAL LOW
RBC: 3.95
RBC: 4.2
RDW: 16.2 — ABNORMAL HIGH
RDW: 16.6 — ABNORMAL HIGH
RDW: 16.8 — ABNORMAL HIGH
RDW: 17 — ABNORMAL HIGH
RDW: 17.1 — ABNORMAL HIGH
RDW: 17.2 — ABNORMAL HIGH
RDW: 17.4 — ABNORMAL HIGH
WBC: 2.9 — ABNORMAL LOW
WBC: 3.2 — ABNORMAL LOW
WBC: 4.2

## 2010-11-09 LAB — URINALYSIS, ROUTINE W REFLEX MICROSCOPIC
Nitrite: POSITIVE — AB
Specific Gravity, Urine: 1.019
Urobilinogen, UA: 1
pH: 6

## 2010-11-09 LAB — COMPREHENSIVE METABOLIC PANEL
AST: 31
Albumin: 1.8 — ABNORMAL LOW
BUN: 81 — ABNORMAL HIGH
Calcium: 7.6 — ABNORMAL LOW
Creatinine, Ser: 2.01 — ABNORMAL HIGH
GFR calc Af Amer: 28 — ABNORMAL LOW
GFR calc non Af Amer: 23 — ABNORMAL LOW
Total Bilirubin: 1.9 — ABNORMAL HIGH

## 2010-11-09 LAB — URINE CULTURE

## 2010-11-09 LAB — BASIC METABOLIC PANEL
BUN: 37 — ABNORMAL HIGH
CO2: 19
CO2: 24
Calcium: 7.2 — ABNORMAL LOW
Calcium: 7.3 — ABNORMAL LOW
Calcium: 7.5 — ABNORMAL LOW
Calcium: 7.6 — ABNORMAL LOW
Chloride: 116 — ABNORMAL HIGH
GFR calc Af Amer: 32 — ABNORMAL LOW
GFR calc Af Amer: 41 — ABNORMAL LOW
GFR calc non Af Amer: 26 — ABNORMAL LOW
GFR calc non Af Amer: 26 — ABNORMAL LOW
GFR calc non Af Amer: 34 — ABNORMAL LOW
Glucose, Bld: 103 — ABNORMAL HIGH
Glucose, Bld: 132 — ABNORMAL HIGH
Glucose, Bld: 151 — ABNORMAL HIGH
Glucose, Bld: 85
Potassium: 3.6
Potassium: 3.9
Potassium: 4
Sodium: 138
Sodium: 138
Sodium: 140
Sodium: 141
Sodium: 144

## 2010-11-09 LAB — URINE MICROSCOPIC-ADD ON

## 2010-11-09 LAB — HEMOGLOBIN AND HEMATOCRIT, BLOOD
Hemoglobin: 11.1 — ABNORMAL LOW
Hemoglobin: 12.1

## 2010-11-09 LAB — PROTIME-INR
INR: 1
INR: 1.2

## 2010-11-09 LAB — TRIGLYCERIDES: Triglycerides: 157 — ABNORMAL HIGH

## 2010-11-09 LAB — CHOLESTEROL, TOTAL: Cholesterol: 192

## 2010-11-09 LAB — PHOSPHORUS: Phosphorus: 5.1 — ABNORMAL HIGH

## 2010-11-09 LAB — MAGNESIUM: Magnesium: 2

## 2011-01-12 ENCOUNTER — Encounter: Payer: Self-pay | Admitting: Emergency Medicine

## 2011-01-12 ENCOUNTER — Inpatient Hospital Stay (HOSPITAL_COMMUNITY)
Admission: EM | Admit: 2011-01-12 | Discharge: 2011-01-18 | DRG: 193 | Disposition: A | Discharge status: Skilled Nursing Facility | Payer: Medicare Other | Attending: Internal Medicine | Admitting: Internal Medicine

## 2011-01-12 ENCOUNTER — Emergency Department (HOSPITAL_COMMUNITY): Payer: Medicare Other

## 2011-01-12 DIAGNOSIS — I10 Essential (primary) hypertension: Secondary | ICD-10-CM

## 2011-01-12 DIAGNOSIS — E119 Type 2 diabetes mellitus without complications: Secondary | ICD-10-CM | POA: Diagnosis present

## 2011-01-12 DIAGNOSIS — E876 Hypokalemia: Secondary | ICD-10-CM

## 2011-01-12 DIAGNOSIS — Z886 Allergy status to analgesic agent status: Secondary | ICD-10-CM

## 2011-01-12 DIAGNOSIS — E860 Dehydration: Secondary | ICD-10-CM

## 2011-01-12 DIAGNOSIS — G9340 Encephalopathy, unspecified: Secondary | ICD-10-CM

## 2011-01-12 DIAGNOSIS — D649 Anemia, unspecified: Secondary | ICD-10-CM | POA: Diagnosis present

## 2011-01-12 DIAGNOSIS — N179 Acute kidney failure, unspecified: Secondary | ICD-10-CM

## 2011-01-12 DIAGNOSIS — C189 Malignant neoplasm of colon, unspecified: Secondary | ICD-10-CM

## 2011-01-12 DIAGNOSIS — Z7901 Long term (current) use of anticoagulants: Secondary | ICD-10-CM

## 2011-01-12 DIAGNOSIS — N390 Urinary tract infection, site not specified: Secondary | ICD-10-CM

## 2011-01-12 DIAGNOSIS — I4891 Unspecified atrial fibrillation: Secondary | ICD-10-CM

## 2011-01-12 DIAGNOSIS — F32A Depression, unspecified: Secondary | ICD-10-CM | POA: Insufficient documentation

## 2011-01-12 DIAGNOSIS — J449 Chronic obstructive pulmonary disease, unspecified: Secondary | ICD-10-CM

## 2011-01-12 DIAGNOSIS — E162 Hypoglycemia, unspecified: Secondary | ICD-10-CM

## 2011-01-12 DIAGNOSIS — F0390 Unspecified dementia without behavioral disturbance: Secondary | ICD-10-CM

## 2011-01-12 DIAGNOSIS — F3289 Other specified depressive episodes: Secondary | ICD-10-CM | POA: Diagnosis present

## 2011-01-12 DIAGNOSIS — Z66 Do not resuscitate: Secondary | ICD-10-CM | POA: Diagnosis present

## 2011-01-12 DIAGNOSIS — J189 Pneumonia, unspecified organism: Principal | ICD-10-CM

## 2011-01-12 DIAGNOSIS — F329 Major depressive disorder, single episode, unspecified: Secondary | ICD-10-CM | POA: Diagnosis present

## 2011-01-12 HISTORY — DX: Pure hypercholesterolemia, unspecified: E78.00

## 2011-01-12 HISTORY — DX: Unspecified dementia, unspecified severity, without behavioral disturbance, psychotic disturbance, mood disturbance, and anxiety: F03.90

## 2011-01-12 HISTORY — DX: Cardiac murmur, unspecified: R01.1

## 2011-01-12 HISTORY — DX: Essential (primary) hypertension: I10

## 2011-01-12 HISTORY — DX: Unspecified atrial fibrillation: I48.91

## 2011-01-12 HISTORY — DX: Pneumonia, unspecified organism: J18.9

## 2011-01-12 HISTORY — DX: Bronchitis, not specified as acute or chronic: J40

## 2011-01-12 HISTORY — DX: Urinary tract infection, site not specified: N39.0

## 2011-01-12 HISTORY — DX: Chronic obstructive pulmonary disease, unspecified: J44.9

## 2011-01-12 HISTORY — DX: Malignant (primary) neoplasm, unspecified: C80.1

## 2011-01-12 LAB — URINALYSIS, ROUTINE W REFLEX MICROSCOPIC
Bilirubin Urine: NEGATIVE
Glucose, UA: NEGATIVE mg/dL
Ketones, ur: NEGATIVE mg/dL
Nitrite: NEGATIVE
Protein, ur: 100 mg/dL — AB
Specific Gravity, Urine: 1.01 (ref 1.005–1.030)
Urobilinogen, UA: 1 mg/dL (ref 0.0–1.0)
pH: 8.5 — ABNORMAL HIGH (ref 5.0–8.0)

## 2011-01-12 LAB — CREATININE, SERUM: GFR calc non Af Amer: 33 mL/min — ABNORMAL LOW (ref >90)

## 2011-01-12 LAB — URINE MICROSCOPIC-ADD ON

## 2011-01-12 LAB — SODIUM, URINE, RANDOM: Sodium, Ur: 34 mEq/L

## 2011-01-12 LAB — CBC
HCT: 32.1 % — ABNORMAL LOW (ref 36.0–46.0)
HCT: 28.9 % — ABNORMAL LOW (ref 36.0–46.0)
Hemoglobin: 10.4 g/dL — ABNORMAL LOW (ref 12.0–15.0)
Hemoglobin: 9.5 g/dL — ABNORMAL LOW (ref 12.0–15.0)
MCH: 28 pg (ref 26.0–34.0)
MCH: 28.2 pg (ref 26.0–34.0)
MCHC: 32.4 g/dL (ref 30.0–36.0)
MCHC: 32.9 g/dL (ref 30.0–36.0)
MCV: 86.5 fL (ref 78.0–100.0)
MCV: 85.8 fL (ref 78.0–100.0)
Platelets: 168 K/uL (ref 150–400)
RBC: 3.71 MIL/uL — ABNORMAL LOW (ref 3.87–5.11)
RDW: 16.6 % — ABNORMAL HIGH (ref 11.5–15.5)
WBC: 6.3 K/uL (ref 4.0–10.5)

## 2011-01-12 LAB — BASIC METABOLIC PANEL WITH GFR
CO2: 27 meq/L (ref 19–32)
Chloride: 99 meq/L (ref 96–112)
Creatinine, Ser: 1.34 mg/dL — ABNORMAL HIGH (ref 0.50–1.10)
GFR calc Af Amer: 37 mL/min — ABNORMAL LOW (ref >90)
Potassium: 3 meq/L — ABNORMAL LOW (ref 3.5–5.1)
Sodium: 136 meq/L (ref 135–145)

## 2011-01-12 LAB — BASIC METABOLIC PANEL
BUN: 35 mg/dL — ABNORMAL HIGH (ref 6–23)
Calcium: 8.1 mg/dL — ABNORMAL LOW (ref 8.4–10.5)
GFR calc non Af Amer: 32 mL/min — ABNORMAL LOW (ref >90)
Glucose, Bld: 158 mg/dL — ABNORMAL HIGH (ref 70–99)

## 2011-01-12 LAB — CREATININE, URINE, RANDOM: Creatinine, Urine: 39.3 mg/dL

## 2011-01-12 LAB — PROTIME-INR: INR: 1.84 — ABNORMAL HIGH (ref 0.00–1.49)

## 2011-01-12 LAB — APTT: aPTT: 42 seconds — ABNORMAL HIGH (ref 24–37)

## 2011-01-12 MED ORDER — OXYCODONE HCL 5 MG PO TABS: 2.5000 mg | ORAL_TABLET | ORAL | Status: DC | PRN

## 2011-01-12 MED ORDER — POLYETHYLENE GLYCOL 3350 17 G PO PACK
17.0000 g | PACK | Freq: Every day | ORAL | Status: DC
  Filled 2011-01-12 (×2): qty 1

## 2011-01-12 MED ORDER — ENOXAPARIN SODIUM 40 MG/0.4ML ~~LOC~~ SOLN: 40.0000 mg | SUBCUTANEOUS | Status: DC

## 2011-01-12 MED ORDER — SODIUM CHLORIDE 0.9 % IJ SOLN: 3.0000 mL | INTRAMUSCULAR | Status: DC | PRN

## 2011-01-12 MED ORDER — POTASSIUM CHLORIDE CRYS ER 20 MEQ PO TBCR
20.0000 meq | EXTENDED_RELEASE_TABLET | Freq: Once | ORAL | Status: AC
  Administered 2011-01-12: 20 meq via ORAL
  Filled 2011-01-12: qty 1

## 2011-01-12 MED ORDER — SODIUM CHLORIDE 0.9 % IV BOLUS (SEPSIS)
500.0000 mL | Freq: Once | INTRAVENOUS | Status: AC
  Administered 2011-01-12: 500 mL via INTRAVENOUS

## 2011-01-12 MED ORDER — PIPERACILLIN-TAZOBACTAM 3.375 G IVPB
3.3750 g | Freq: Once | INTRAVENOUS | Status: AC
  Administered 2011-01-12: 3.375 g via INTRAVENOUS
  Filled 2011-01-12: qty 50

## 2011-01-12 MED ORDER — DEXTROSE 50 % IV SOLN
INTRAVENOUS | Status: AC
  Filled 2011-01-12: qty 50

## 2011-01-12 MED ORDER — DIGOXIN 125 MCG PO TABS
125.0000 ug | ORAL_TABLET | ORAL | Status: DC
  Administered 2011-01-15 – 2011-01-17 (×2): 125 ug via ORAL
  Filled 2011-01-12 (×2): qty 1

## 2011-01-12 MED ORDER — ONDANSETRON HCL 4 MG/2ML IJ SOLN: 4.0000 mg | Freq: Four times a day (QID) | INTRAMUSCULAR | Status: DC | PRN

## 2011-01-12 MED ORDER — ENSURE PO LIQD
237.0000 mL | Freq: Two times a day (BID) | ORAL | Status: DC
  Administered 2011-01-13 (×2): 237 mL via ORAL
  Administered 2011-01-14 – 2011-01-15 (×3): via ORAL
  Administered 2011-01-15 – 2011-01-17 (×4): 237 mL via ORAL
  Filled 2011-01-12 (×14): qty 237

## 2011-01-12 MED ORDER — SODIUM CHLORIDE 0.9 % IJ SOLN
3.0000 mL | Freq: Two times a day (BID) | INTRAMUSCULAR | Status: DC
  Administered 2011-01-12 – 2011-01-16 (×8): 3 mL via INTRAVENOUS

## 2011-01-12 MED ORDER — SODIUM CHLORIDE 0.9 % IV SOLN
250.0000 mL | INTRAVENOUS | Status: DC | PRN
  Administered 2011-01-14: 250 mL via INTRAVENOUS

## 2011-01-12 MED ORDER — WARFARIN SODIUM 7.5 MG PO TABS
7.5000 mg | ORAL_TABLET | Freq: Once | ORAL | Status: AC
  Administered 2011-01-12: 7.5 mg via ORAL
  Filled 2011-01-12: qty 1

## 2011-01-12 MED ORDER — ONDANSETRON HCL 4 MG PO TABS: 4.0000 mg | ORAL_TABLET | Freq: Four times a day (QID) | ORAL | Status: DC | PRN

## 2011-01-12 MED ORDER — IPRATROPIUM BROMIDE 0.02 % IN SOLN
0.5000 mg | Freq: Four times a day (QID) | RESPIRATORY_TRACT | Status: DC
  Administered 2011-01-12 (×2): 0.5 mg via RESPIRATORY_TRACT
  Filled 2011-01-12 (×2): qty 2.5

## 2011-01-12 MED ORDER — ALBUTEROL SULFATE (5 MG/ML) 0.5% IN NEBU
2.5000 mg | INHALATION_SOLUTION | Freq: Four times a day (QID) | RESPIRATORY_TRACT | Status: DC
  Administered 2011-01-12 (×2): 2.5 mg via RESPIRATORY_TRACT
  Filled 2011-01-12 (×2): qty 0.5

## 2011-01-12 MED ORDER — FUROSEMIDE 80 MG PO TABS
120.0000 mg | ORAL_TABLET | ORAL | Status: DC
  Administered 2011-01-13: 120 mg via ORAL
  Filled 2011-01-12 (×2): qty 1

## 2011-01-12 MED ORDER — VANCOMYCIN HCL IN DEXTROSE 1-5 GM/200ML-% IV SOLN
1000.0000 mg | Freq: Once | INTRAVENOUS | Status: AC
  Administered 2011-01-12: 1000 mg via INTRAVENOUS
  Filled 2011-01-12: qty 200

## 2011-01-12 MED ORDER — ALBUTEROL SULFATE (5 MG/ML) 0.5% IN NEBU
2.5000 mg | INHALATION_SOLUTION | RESPIRATORY_TRACT | Status: DC | PRN
  Filled 2011-01-12: qty 0.5

## 2011-01-12 MED ORDER — SENNOSIDES-DOCUSATE SODIUM 8.6-50 MG PO TABS
1.0000 | ORAL_TABLET | Freq: Every evening | ORAL | Status: DC | PRN
  Filled 2011-01-12: qty 1

## 2011-01-12 MED ORDER — WARFARIN SODIUM 5 MG PO TABS: 5.0000 mg | ORAL_TABLET | ORAL | Status: DC

## 2011-01-12 MED ORDER — POTASSIUM CHLORIDE CRYS ER 10 MEQ PO TBCR
10.0000 meq | EXTENDED_RELEASE_TABLET | Freq: Every day | ORAL | Status: DC
  Administered 2011-01-12 – 2011-01-18 (×7): 10 meq via ORAL
  Filled 2011-01-12 (×7): qty 1

## 2011-01-12 MED ORDER — ALUM & MAG HYDROXIDE-SIMETH 200-200-20 MG/5ML PO SUSP: 30.0000 mL | Freq: Four times a day (QID) | ORAL | Status: DC | PRN

## 2011-01-12 MED ORDER — ALBUTEROL SULFATE (5 MG/ML) 0.5% IN NEBU
2.5000 mg | INHALATION_SOLUTION | Freq: Three times a day (TID) | RESPIRATORY_TRACT | Status: DC
  Administered 2011-01-13: 2.5 mg via RESPIRATORY_TRACT

## 2011-01-12 MED ORDER — ACETAMINOPHEN 325 MG PO TABS: 650.0000 mg | ORAL_TABLET | Freq: Three times a day (TID) | ORAL | Status: DC | PRN

## 2011-01-12 MED ORDER — IPRATROPIUM BROMIDE 0.02 % IN SOLN
0.5000 mg | Freq: Three times a day (TID) | RESPIRATORY_TRACT | Status: DC
  Administered 2011-01-13: 0.5 mg via RESPIRATORY_TRACT

## 2011-01-12 MED ORDER — PANTOPRAZOLE SODIUM 40 MG PO TBEC
40.0000 mg | DELAYED_RELEASE_TABLET | Freq: Every day | ORAL | Status: DC
  Administered 2011-01-12 – 2011-01-18 (×7): 40 mg via ORAL
  Filled 2011-01-12 (×8): qty 1

## 2011-01-12 MED ORDER — FUROSEMIDE 80 MG PO TABS
80.0000 mg | ORAL_TABLET | Freq: Every evening | ORAL | Status: DC
  Administered 2011-01-12: 80 mg via ORAL
  Filled 2011-01-12 (×2): qty 1

## 2011-01-12 MED ORDER — IPRATROPIUM BROMIDE 0.02 % IN SOLN
0.5000 mg | RESPIRATORY_TRACT | Status: DC | PRN
  Filled 2011-01-12: qty 2.5

## 2011-01-12 MED ORDER — GUAIFENESIN ER 600 MG PO TB12
600.0000 mg | ORAL_TABLET | Freq: Two times a day (BID) | ORAL | Status: DC
  Administered 2011-01-12 – 2011-01-18 (×12): 600 mg via ORAL
  Filled 2011-01-12 (×14): qty 1

## 2011-01-12 MED ORDER — METOPROLOL SUCCINATE ER 25 MG PO TB24
25.0000 mg | ORAL_TABLET | Freq: Two times a day (BID) | ORAL | Status: DC
  Administered 2011-01-12 – 2011-01-18 (×10): 25 mg via ORAL
  Filled 2011-01-12 (×13): qty 1

## 2011-01-12 MED ORDER — VANCOMYCIN HCL 1000 MG IV SOLR
750.0000 mg | INTRAVENOUS | Status: DC
  Administered 2011-01-13 – 2011-01-14 (×2): 750 mg via INTRAVENOUS
  Filled 2011-01-12 (×2): qty 750

## 2011-01-12 MED ORDER — PIPERACILLIN-TAZOBACTAM IN DEX 2-0.25 GM/50ML IV SOLN
2.2500 g | Freq: Four times a day (QID) | INTRAVENOUS | Status: DC
  Administered 2011-01-12 – 2011-01-14 (×8): 2.25 g via INTRAVENOUS
  Filled 2011-01-12 (×9): qty 50

## 2011-01-12 NOTE — Progress Notes (Signed)
Room 4714  [name]  PMT (Palliative Medicine Team) RN Liaison Visit Consult for GOC ordered by Dr. Janee Morn.  Spoke with dtr [name] via phone-scheduled for Sat 12/22 @ 12 noon with Dr. Mauro Kaufmann.  Please call PMT phone @ [phone] with any questions. Chalmers Cater RN,  [facility]   Palliative Medicine Team RN Liaison Assistant

## 2011-01-12 NOTE — ED Notes (Signed)
Attempted to call report to [phone] state they will call me back once they call admitting md for clarification.

## 2011-01-12 NOTE — Progress Notes (Signed)
Pharmacy Consult for coumadin, vancomycin, zosyn Indication: afib, r/o PNA  Allergies  Allergen Reactions  . Codeine Other (See Comments)    Per [facility]    Patient Measurements: Height: 5\' 5"  (165.1 cm) Weight: 145 lb (65.772 kg) IBW/kg (Calculated) : 57   Vital Signs: Temp: 99.7 F (37.6 C) (12/21 1147) Temp src: Rectal (12/21 1147) BP: 111/46 mmHg (12/21 1335) Pulse Rate: 90  (12/21 1335)  Labs:  Basename 01/12/11 1004  HGB 10.4*  HCT 32.1*  PLT 168  APTT --  LABPROT --  INR --  HEPARINUNFRC --  CREATININE 1.34*  CKTOTAL --  CKMB --  TROPONINI --   Estimated Creatinine Clearance: 21.1 ml/min (by C-G formula based on Cr of 1.34).  Medical History: Past Medical History  Diagnosis Date  . Diabetes mellitus   . Hypertension   . Atrial fibrillation   . Dementia     Assessment: 77 yo female here with likely PNA to to start on vancomycin/zosyn (zosyn 3.375gm and vancomycin 1000mg  IV given in ED). Patient also noted with chronic afib on coumadin PTA (regimen: 5mg  MTuWeFrSa; none on SuTh).   Goal of Therapy:  INR 2-3 Vancomycin trough=15-20   Plan:  1)  Continue vancomycin 750mg  Iv q24hr and zosyn 2.25gm IV q6h 2) PT/INR now and daily with plans for home regimen if patient at goal INR.   Benny Lennert 01/12/2011,2:22 PM

## 2011-01-12 NOTE — ED Notes (Signed)
Per ems- pt coming from nursing facility where they were dispatched for respiratory distress.  Initially ems report pt was lethargic.  Pt initial blood sugar was 47, ems gave 1 amp of d5, blood sugar was 136 on arrival to er.  Pt was given albuterol treatment in route.  Pt has auditory wheezing still. Pt is at baseline neuro status per ems report.

## 2011-01-12 NOTE — ED Provider Notes (Signed)
History     CSN: [id number]  Arrival date & time 01/12/11  0915   First MD Initiated Contact with Patient 01/12/11 [phone]      Chief Complaint  Patient presents with  . Shortness of Breath    (Consider location/radiation/quality/duration/timing/severity/associated sxs/prior treatment) HPI Comments: Pt is at a demntia unit, family has concerns on ability to meet her needs.  Pt with bad cough for several days, CXR was reportedly ok.  Pt with drop in blood sugar this AM to 47, pt was unresponsive.  Pt with decreased eating in opinion of family recently.  Pt's personal RN and daughter feel pt would benefit from higher level of care.  PCP Dr. Tenny Craw was in process of referring her to palliative care, but has not done so yet.  Unknown fever or chills. No N/V.  Pt denies sore throat.  Pt repeatedly just states that she wants to go home and that she is lonely.  Pt denies CP or back pain.  No HA.  Pt with h/o DM, but was taken off of DM meds per family.  Pt has h/o dementia and atrial fib.  Unsure if pt has had meds at home or not.  Pt is DNR with paperwork present.  Pt is not on home O2, arrived on 2L of [place] O2.  EMS had given D50 en route.    Patient is a 77 y.o. female presenting with shortness of breath. The history is provided by the patient, a relative, the EMS personnel and the nursing home. The history is limited by the condition of the patient.  Shortness of Breath  Associated symptoms include cough.    Past Medical History  Diagnosis Date  . Diabetes mellitus   . Hypertension   . Atrial fibrillation   . Dementia     No past surgical history on file.  No family history on file.  History  Substance Use Topics  . Smoking status: Not on file  . Smokeless tobacco: Not on file  . Alcohol Use:     OB History    Grav Para Term Preterm Abortions TAB SAB Ect Mult Living                  Review of Systems  Unable to perform ROS: Dementia  Respiratory: Positive for cough.      Allergies  Codeine  Home Medications   Current Outpatient Rx  Name Route Sig Dispense Refill  . VITAMIN D 1000 UNITS PO TABS Oral Take 2,000 Units by mouth daily.      Marland Kitchen DIGOXIN 0.125 MG PO TABS Oral Take 125 mcg by mouth every Monday, Wednesday, and Friday.      . ENSURE PO LIQD Oral Take 237 mLs by mouth 2 (two) times daily between meals.      . FUROSEMIDE 40 MG PO TABS Oral Take 120 mg by mouth every morning.      . FUROSEMIDE 80 MG PO TABS Oral Take 80 mg by mouth every evening.      Marland Kitchen GLYBURIDE 5 MG PO TABS Oral Take 5 mg by mouth 2 (two) times daily with a meal.      . METOPROLOL SUCCINATE ER 25 MG PO TB24 Oral Take 25 mg by mouth 2 (two) times daily.      Marland Kitchen MIRTAZAPINE 15 MG PO TABS Oral Take 15 mg by mouth at bedtime.      Marland Kitchen PANTOPRAZOLE SODIUM 40 MG PO TBEC Oral Take 40 mg by mouth  daily.      Marland Kitchen POLYETHYLENE GLYCOL 3350 PO PACK Oral Take 17 g by mouth daily.      Marland Kitchen POTASSIUM CHLORIDE 10 MEQ PO TBCR Oral Take 10 mEq by mouth 2 (two) times daily.      . WARFARIN SODIUM 5 MG PO TABS Oral Take 5 mg by mouth See admin instructions. Takes on m,tue,wed,fri,sat     . ACETAMINOPHEN 325 MG PO TABS Oral Take 650 mg by mouth 3 (three) times daily as needed. For pain       BP 118/51  Pulse 90  Temp(Src) 99.7 F (37.6 C) (Rectal)  Resp 23  Ht 5\' 5"  (1.651 m)  Wt 145 lb (65.772 kg)  BMI 24.13 kg/m2  SpO2 96%  Physical Exam  Nursing note and vitals reviewed. Constitutional: She appears well-developed.  HENT:  Head: Normocephalic.  Eyes: Pupils are equal, round, and reactive to light. No scleral icterus.  Neck: Normal range of motion. Neck supple.  Cardiovascular: An irregular rhythm present.  Occasional extrasystoles are present. Tachycardia present.   Pulmonary/Chest: No respiratory distress. She has no wheezes. She has rhonchi.       + productive cough, no overt wheezing or coughing  Abdominal: Soft. There is no tenderness.  Musculoskeletal: She exhibits edema.   Neurological: She is alert.       No facial droop, will respond and answer simple questions for me  Skin: Skin is warm. No rash noted.    ED Course  Procedures (including critical care time)  Labs Reviewed  URINALYSIS, ROUTINE W REFLEX MICROSCOPIC - Abnormal; Notable for the following:    APPearance CLOUDY (*)    pH 8.5 (*)    Hgb urine dipstick LARGE (*)    Protein, ur 100 (*)    Leukocytes, UA LARGE (*)    All other components within normal limits  CBC - Abnormal; Notable for the following:    RBC 3.71 (*)    Hemoglobin 10.4 (*)    HCT 32.1 (*)    RDW 16.6 (*)    All other components within normal limits  BASIC METABOLIC PANEL - Abnormal; Notable for the following:    Potassium 3.0 (*)    Glucose, Bld 158 (*)    BUN 35 (*)    Creatinine, Ser 1.34 (*)    Calcium 8.1 (*)    GFR calc non Af Amer 32 (*)    GFR calc Af Amer 37 (*)    All other components within normal limits  URINE MICROSCOPIC-ADD ON - Abnormal; Notable for the following:    Squamous Epithelial / LPF MANY (*)    Bacteria, UA MANY (*)    All other components within normal limits  URINE CULTURE   Dg Chest 2 View  01/12/2011  *RADIOLOGY REPORT*  Clinical Data: Cough with shortness of breath.  CHEST - 2 VIEW  Comparison: 01/28/2009.  Findings: Cardiomegaly with mild vascular congestion.  Retrocardiac density on the right appears increased from priors along with slight prominence of what may be a right hilar lymph nodes.  Early right lower lobe pneumonia not excluded.  No effusion or pneumothorax.  Bones unremarkable.  IMPRESSION: Cardiomegaly with mild vascular congestion.  Cannot exclude early right lower lobe infiltrate.  Original Report Authenticated By: Elsie Stain, M.D.     No diagnosis found.  On monitor, atrial fib with RVR, rate in 110's.  MDM  Pt with cough, will get CXR.  RA sat 93-98%, normal. Will get CXR,  basic labs.  Pt likely would benefit from hospice consult and brief admission for URI  and need for placement to higher level facility.        12:13 PM CXR here per radiologist suggests possible early RLL infiltrate, will call it HAP and start IV abx.  Will discuss with [facility].    Gavin Pound. Zayd Bonet, MD 01/12/11 1213

## 2011-01-12 NOTE — ED Notes (Signed)
Talked with social worker Dahlia Client about pt family not feeling comfortable with pt going back to nursing facility.  Social worker states she will have someone to see pt today or this weekend sometime.

## 2011-01-12 NOTE — ED Notes (Signed)
Pt family expressing concerns about taking pt back to nursing facility as they do not believe they can adequately take care of her.  Social work has been paged to come talk to the family.

## 2011-01-12 NOTE — Progress Notes (Signed)
ANTICOAGULATION CONSULT NOTE - Initial Consult  Pharmacy Consult for Warfarin Indication: atrial fibrillation  Assessment: 77yo F with chronic AFib on coumadin PTA (regimen: 5mg  MoTuWeFrSa, last dose Wed 12/19) and digoxin. Today she is due for a dose, but is slightly subtherapeutic. Pt's H/H and platelets are low, but no Sx of bleeding are noted and guaiac stools x3 have been ordered. Will bump her usual dose of 5mg  to 7.5mg .  Goal of Therapy:  INR 2-3   Plan:  1. Coumadin 7.5mg  x1 2. Will f/u with AM labs and monitor for signs of bleeding (including guaiac stool results)  Wyline Copas 01/12/2011,7:31 PM   Allergies  Allergen Reactions  . Codeine Other (See Comments)    Per [facility]    Patient Measurements: Height: 5\' 5"  (165.1 cm) Weight: 137 lb 9.6 oz (62.415 kg) IBW/kg (Calculated) : 57   Vital Signs: Temp: 98.5 F (36.9 C) (12/21 1808) Temp src: Axillary (12/21 1808) BP: 115/62 mmHg (12/21 1808) Pulse Rate: 97  (12/21 1808)  Labs:  [facility] 01/12/11 1536 01/12/11 1004  HGB 9.5* 10.4*  HCT 28.9* 32.1*  PLT 141* 168  APTT 42* --  LABPROT 21.6* --  INR 1.84* --  HEPARINUNFRC -- --  CREATININE 1.30* 1.34*  CKTOTAL -- --  CKMB -- --  TROPONINI -- --   Estimated Creatinine Clearance: 21.7 ml/min (by C-G formula based on Cr of 1.3).  Medical History: Past Medical History  Diagnosis Date  . Diabetes mellitus   . Hypertension   . Atrial fibrillation   . Dementia   . Cancer 1970's; 2005    colon  . Hypercholesterolemia   . Heart murmur   . UTI (lower urinary tract infection)     "it does reoccur"  . Pneumonia   . COPD (chronic obstructive pulmonary disease) 01/12/11  . Bronchitis 2007

## 2011-01-12 NOTE — ED Notes (Signed)
When social worker sees pt family requests to have her call daughter Malen Gauze at [phone] to make sure she is here to speak with her.

## 2011-01-12 NOTE — H&P (Signed)
[name] MRN: [id number] DOB/AGE: [birth year] 75 y.o. 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 Patient also with complaints of the 2 day history of non-productive cough. Patient also has decreased appetite. Per family patient with emesis 2 days prior to admission. Patient does have dementia is following commands however history is unreliable and most of the history is obtained from her family was at bedside. Patient and family deny any fever no chills, no nausea, no abdominal pain, no diarrhea, no chest pain, no shortness of breath, no weakness. Patient does endorse some dysuria and decreased appetite and constipation. Patient was seen in the emergency room chest x-ray which was done showed a probable early basilar pneumonia. CBC which was done did have a hemoglobin of 10.4 white count of 6.3 otherwise was within normal limits. Be met which was obtained did show a potassium of 3.0 BUN of 35 and a creatinine of 1.34 with a calcium of 8.1. Urinalysis which was done did have large leukocytes. Patient was given some IV vancomycin and Zosyn will call to admit the patient for further evaluation and management.  Past Medical History  Diagnosis Date  . Diabetes mellitus   . Hypertension   . Atrial fibrillation   . Dementia     No past surgical history on file.  Prior to Admission medications   Medication Sig Start Date End Date Taking? Authorizing Provider  cholecalciferol (VITAMIN D) 1000 UNITS tablet Take 2,000 Units by mouth daily.     Yes Historical Provider, MD  digoxin (LANOXIN) 0.125 MG tablet  Take 125 mcg by mouth every Monday, Wednesday, and Friday.     Yes Historical Provider, MD  ENSURE (ENSURE) Take 237 mLs by mouth 2 (two) times daily between meals.     Yes Historical Provider, MD  furosemide (LASIX) 40 MG tablet Take 120 mg by mouth every morning.     Yes Historical Provider, MD  furosemide (LASIX) 80 MG tablet Take 80 mg by mouth every evening.     Yes Historical Provider, MD  glyBURIDE (DIABETA) 5 MG tablet Take 5 mg by mouth 2 (two) times daily with a meal.     Yes Historical Provider, MD  metoprolol succinate (TOPROL-XL) 25 MG 24 hr tablet Take 25 mg by mouth 2 (two) times daily.     Yes Historical Provider, MD  mirtazapine (REMERON) 15 MG tablet Take 15 mg by mouth at bedtime.     Yes Historical Provider, MD  pantoprazole (PROTONIX) 40 MG tablet Take 40 mg by mouth daily.     Yes Historical Provider, MD  polyethylene glycol (MIRALAX / GLYCOLAX) packet Take 17 g by mouth daily.     Yes Historical Provider, MD  potassium chloride (KLOR-CON) 10 MEQ CR tablet Take 10 mEq by mouth 2 (two) times daily.     Yes Historical Provider, MD  warfarin (COUMADIN) 5 MG tablet Take 5 mg by mouth See admin instructions. Takes on m,tue,wed,fri,sat    Yes Historical Provider, MD  acetaminophen (TYLENOL) 325 MG tablet Take 650 mg by mouth 3 (three) times daily as needed. For pain     Historical Provider, MD  Allergies:  Allergies  Allergen Reactions  . Codeine Other (See Comments)    Per MAR    No family history on file.  Social History:  does not have a smoking history on file. She does not have any smokeless tobacco history on file. Her alcohol and drug histories not on file.  ROS: All systems reviewed with the patient and was positive as per HPI otherwise all other systems are negative.  PHYSICAL EXAM: Blood pressure 111/46, pulse 90, temperature 99.7 F (37.6 C), temperature source Rectal, resp. rate 24, height 5\' 5"  (1.651 m), weight 65.772 kg (145 lb), SpO2 100.00%. General:  Well-developed, well-nourished. In no acute cardiopulmonary distress. HEENT: Normocephalic, atraumatic. Pupils equal round and reactive to light and accommodation. Extraocular movements intact. Oropharynx is clear no lesions no exudates. Neck is supple no lymphadenopathy. No bruits, no goiter. Heart: Irregularly irregular, without murmurs, rubs, gallops. Lungs: Coarse breath sounds/rhonchi right greater than left side. No wheezing. Abdomen: Soft, nontender, nondistended, positive bowel sounds. Extremities: No clubbing cyanosis or edema with positive pedal pulses. Neuro: Alert to self. Patient is moving extremities spontaneously. Cranial nerves II through XII are grossly intact.    EKG: None No results found for this or any previous visit (from the past 240 hour(s)).   Lab results:  Basename 01/12/11 1004  NA 136  K 3.0*  CL 99  CO2 27  GLUCOSE 158*  BUN 35*  CREATININE 1.34*  CALCIUM 8.1*  MG --  PHOS --   No results found for this basename: AST:2,ALT:2,ALKPHOS:2,BILITOT:2,PROT:2,ALBUMIN:2 in the last 72 hours No results found for this basename: LIPASE:2,AMYLASE:2 in the last 72 hours  Basename 01/12/11 1004  WBC 6.3  NEUTROABS --  HGB 10.4*  HCT 32.1*  MCV 86.5  PLT 168   No results found for this basename: CKTOTAL:3,CKMB:3,CKMBINDEX:3,TROPONINI:3 in the last 72 hours No components found with this basename: POCBNP:3 No results found for this basename: DDIMER in the last 72 hours No results found for this basename: HGBA1C:2 in the last 72 hours No results found for this basename: CHOL:2,HDL:2,LDLCALC:2,TRIG:2,CHOLHDL:2,LDLDIRECT:2 in the last 72 hours No results found for this basename: TSH,T4TOTAL,FREET3,T3FREE,THYROIDAB in the last 72 hours No results found for this basename: VITAMINB12:2,FOLATE:2,FERRITIN:2,TIBC:2,IRON:2,RETICCTPCT:2 in the last 72 hours Imaging results:  Dg Chest 2 View  01/12/2011  *RADIOLOGY REPORT*  Clinical Data: Cough with shortness of breath.   CHEST - 2 VIEW  Comparison: 01/28/2009.  Findings: Cardiomegaly with mild vascular congestion.  Retrocardiac density on the right appears increased from priors along with slight prominence of what may be a right hilar lymph nodes.  Early right lower lobe pneumonia not excluded.  No effusion or pneumothorax.  Bones unremarkable.  IMPRESSION: Cardiomegaly with mild vascular congestion.  Cannot exclude early right lower lobe infiltrate.  Original Report Authenticated By: Elsie Stain, M.D.   Impression/Plan:  Principal Problem:  *HCAP (healthcare-associated pneumonia) Active Problems:  Hypokalemia  UTI (lower urinary tract infection)  Encephalopathy acute  Anemia  Diabetes mellitus  A-fib  HTN (hypertension)  Depression  Dementia  Cancer, colon  Hypoglycemia   #1 probable early healthcare associated pneumonia Patient does have a nonproductive cough, rhonchi/coarse breath sounds on exam and chest x-ray with probable early pneumonia. Will admit the patient. Will check a sputum Gram stain and culture. We'll place on oxygen, mucolytic, nebs, empiric IV vancomycin and Zosyn. Follow #2 encephalopathy Secondary to hypoglycemia. Improved with D50. Will hold patient's oral hypoglycemics. Will check CBGs every 4 hours. If further hypoglycemic spells we'll place  on D5. #3 hypokalemia Check a magnesium level. Replete. #4 probable urinary tract infection Urine cultures are pending. On IV Zosyn. #5 anemia Will check an anemia panel. Guaiac stools x3. Follow H&H. #6 diabetes mellitus Check CBGs every 4 hours hold patient's oral hypoglycemics. Follow. If this blood sugars are stable may consider a sliding scale insulin. #7 atrial fibrillation Continue metoprolol, diltiazem, digoxin for rate control. Coumadin for anti-coagulation. #8 hypertension Metoprolol and diltiazem. #9 dementia #10 prophylaxis Lovenox for DVT prophylaxis. #11 code status: DO NOT RESUSCITATE. Per family patient's PCP was in  the process of getting a hospice/palliative care consult. We'll go ahead and consult with palliative care for goals of care. Is been a pleasure taking care of Mrs Lachapelle.    Klark Vanderhoef 01/12/2011, 1:49 PM

## 2011-01-13 DIAGNOSIS — N179 Acute kidney failure, unspecified: Secondary | ICD-10-CM | POA: Diagnosis present

## 2011-01-13 LAB — BASIC METABOLIC PANEL
Calcium: 8.1 mg/dL — ABNORMAL LOW (ref 8.4–10.5)
GFR calc Af Amer: 34 mL/min — ABNORMAL LOW (ref >90)
GFR calc non Af Amer: 29 mL/min — ABNORMAL LOW (ref >90)
Glucose, Bld: 60 mg/dL — ABNORMAL LOW (ref 70–99)
Sodium: 142 mEq/L (ref 135–145)

## 2011-01-13 LAB — HEMOGLOBIN A1C
Hgb A1c MFr Bld: 6.6 % — ABNORMAL HIGH (ref <5.7)
Mean Plasma Glucose: 143 mg/dL — ABNORMAL HIGH (ref <117)

## 2011-01-13 LAB — IRON AND TIBC
Iron: 11 ug/dL — ABNORMAL LOW (ref 42–135)
TIBC: 190 ug/dL — ABNORMAL LOW (ref 250–470)

## 2011-01-13 LAB — FERRITIN: Ferritin: 108 ng/mL (ref 10–291)

## 2011-01-13 LAB — PROTIME-INR
INR: 2.16 — ABNORMAL HIGH (ref 0.00–1.49)
Prothrombin Time: 24.5 seconds — ABNORMAL HIGH (ref 11.6–15.2)

## 2011-01-13 LAB — INFLUENZA PANEL BY PCR (TYPE A & B)
Influenza A By PCR: NEGATIVE
Influenza B By PCR: NEGATIVE

## 2011-01-13 LAB — DIFFERENTIAL
Basophils Absolute: 0 10*3/uL (ref 0.0–0.1)
Eosinophils Absolute: 0 10*3/uL (ref 0.0–0.7)
Eosinophils Relative: 1 % (ref 0–5)
Lymphocytes Relative: 16 % (ref 12–46)

## 2011-01-13 LAB — GLUCOSE, CAPILLARY
Glucose-Capillary: 71 mg/dL (ref 70–99)
Glucose-Capillary: 104 mg/dL — ABNORMAL HIGH (ref 70–99)

## 2011-01-13 LAB — CBC
MCH: 28.4 pg (ref 26.0–34.0)
MCHC: 32.7 g/dL (ref 30.0–36.0)
Platelets: 149 10*3/uL — ABNORMAL LOW (ref 150–400)
RBC: 3.1 MIL/uL — ABNORMAL LOW (ref 3.87–5.11)

## 2011-01-13 LAB — FOLATE: Folate: >20 ng/mL

## 2011-01-13 LAB — APTT: aPTT: 56 seconds — ABNORMAL HIGH (ref 24–37)

## 2011-01-13 MED ORDER — POLYETHYLENE GLYCOL 3350 17 G PO PACK
17.0000 g | PACK | Freq: Every day | ORAL | Status: DC | PRN
  Filled 2011-01-13: qty 1

## 2011-01-13 MED ORDER — SODIUM CHLORIDE 0.9 % IV SOLN
INTRAVENOUS | Status: AC
  Administered 2011-01-13: 11:00:00 via INTRAVENOUS

## 2011-01-13 MED ORDER — ALUM & MAG HYDROXIDE-SIMETH 200-200-20 MG/5ML PO SUSP
30.0000 mL | Freq: Every day | ORAL | Status: DC
  Administered 2011-01-13 – 2011-01-18 (×6): 30 mL via ORAL
  Filled 2011-01-13 (×6): qty 30

## 2011-01-13 MED ORDER — WARFARIN SODIUM 5 MG PO TABS
5.0000 mg | ORAL_TABLET | Freq: Once | ORAL | Status: AC
  Administered 2011-01-13: 5 mg via ORAL
  Filled 2011-01-13 (×2): qty 1

## 2011-01-13 MED ORDER — POTASSIUM CHLORIDE CRYS ER 20 MEQ PO TBCR
40.0000 meq | EXTENDED_RELEASE_TABLET | Freq: Two times a day (BID) | ORAL | Status: AC
  Administered 2011-01-13 (×2): 40 meq via ORAL
  Filled 2011-01-13 (×2): qty 2

## 2011-01-13 MED ORDER — OSELTAMIVIR PHOSPHATE 75 MG PO CAPS
75.0000 mg | ORAL_CAPSULE | Freq: Every day | ORAL | Status: DC
  Administered 2011-01-13 – 2011-01-14 (×2): 75 mg via ORAL
  Filled 2011-01-13 (×2): qty 1

## 2011-01-13 MED ORDER — ALBUTEROL SULFATE (5 MG/ML) 0.5% IN NEBU: 2.5000 mg | INHALATION_SOLUTION | RESPIRATORY_TRACT | Status: DC | PRN

## 2011-01-13 NOTE — Progress Notes (Signed)
PT Cancellation Note  Evaluation cancelled today due to daughter requesting that palliative care meeting take place for goals of care before she decides if PT is appropriate at this time.  01/13/2011 Edwyna Perfect, PT  Pager [phone]

## 2011-01-13 NOTE — Progress Notes (Signed)
Pts HR dropped to 37. Vitals stable and pt is asymptomatic. Dr David Stall notified.

## 2011-01-13 NOTE — Progress Notes (Signed)
INITIAL ADULT NUTRITION ASSESSMENT Date: 01/13/2011   Time: 3:01 PM Reason for Assessment: Consult  ASSESSMENT: Female 77 y.o.  Dx: HCAP (healthcare-associated pneumonia)  Hx:  Past Medical History  Diagnosis Date  . Diabetes mellitus   . Hypertension   . Atrial fibrillation   . Dementia   . Cancer 1970's; 2005    colon  . Hypercholesterolemia   . Heart murmur   . UTI (lower urinary tract infection)     "it does reoccur"  . Pneumonia   . COPD (chronic obstructive pulmonary disease) 01/12/11  . Bronchitis 2007   Related Meds: Scheduled Meds:   . alum & mag hydroxide-simeth  30 mL Oral Daily  . dextrose      . digoxin  125 mcg Oral Q M,W,F  . ENSURE  237 mL Oral BID BM  . guaiFENesin  600 mg Oral BID  . metoprolol succinate  25 mg Oral BID  . oseltamivir  75 mg Oral Daily  . pantoprazole  40 mg Oral Daily  . piperacillin-tazobactam (ZOSYN)  IV  2.25 g Intravenous Q6H  . piperacillin-tazobactam (ZOSYN)  IV  3.375 g Intravenous Once  . potassium chloride  10 mEq Oral Daily  . potassium chloride  40 mEq Oral BID  . sodium chloride  3 mL Intravenous Q12H  . vancomycin  750 mg Intravenous Q24H  . warfarin  5 mg Oral ONCE-1800  . warfarin  7.5 mg Oral Once  . DISCONTD: albuterol  2.5 mg Nebulization Q6H  . DISCONTD: albuterol  2.5 mg Nebulization TID  . DISCONTD: enoxaparin  40 mg Subcutaneous Q24H  . DISCONTD: furosemide  120 mg Oral Q0700  . DISCONTD: furosemide  80 mg Oral QPM  . DISCONTD: ipratropium  0.5 mg Nebulization Q6H  . DISCONTD: ipratropium  0.5 mg Nebulization TID  . DISCONTD: polyethylene glycol  17 g Oral Daily  . DISCONTD: warfarin  5 mg Oral See admin instructions   Continuous Infusions:   . sodium chloride 75 mL/hr at 01/13/11 1058   PRN Meds:.sodium chloride, acetaminophen, albuterol, albuterol, ipratropium, ondansetron (ZOFRAN) IV, ondansetron, oxyCODONE, polyethylene glycol, senna-docusate, sodium chloride, DISCONTD: alum & mag  hydroxide-simeth  Ht: 5\' 5"  (165.1 cm)  Wt: 139 lb 1.8 oz (63.1 kg) (bed wt)  Ideal Wt: 56.8kg % Ideal Wt: 111  Usual Wt: Unable to assess % Usual Wt: Unable to assess  Body mass index is 23.15 kg/(m^2).  Food/Nutrition Related Hx: Pt with dementia and no family present. Per H&P, family reported poor appetite with emesis 2 days PTA, however nursing states pt ate well today and is drinking Ensure, likes chocolate flavor. Noted palliative care following pt, however family wanting aggressive treatment.   Labs:  CMP     Component Value Date/Time   NA 142 01/13/2011 0620   K 3.3* 01/13/2011 0620   CL 107 01/13/2011 0620   CO2 26 01/13/2011 0620   GLUCOSE 60* 01/13/2011 0620   BUN 33* 01/13/2011 0620   CREATININE 1.44* 01/13/2011 0620   CALCIUM 8.1* 01/13/2011 0620   PROT 5.0* 06/24/2006 0300   ALBUMIN 1.8* 06/24/2006 0300   AST 31 06/24/2006 0300   ALT 30 06/24/2006 0300   ALKPHOS 110 06/24/2006 0300   BILITOT 1.9* 06/24/2006 0300   GFRNONAA 29* 01/13/2011 0620   GFRAA 34* 01/13/2011 0620   CBG (last 3)   Basename 01/13/11 0617 01/12/11 2121 01/12/11 1639  GLUCAP 71 233* 173*   Lab Results  Component Value Date   HGBA1C 6.6*  01/12/2011    Intake/Output Summary (Last 24 hours) at 01/13/11 1505 Last data filed at 01/13/11 1056  Gross per 24 hour  Intake    326 ml  Output    725 ml  Net   -399 ml    Diet Order: Carb Control  Supplements/Tube Feeding: Ensure BID  IVF:    sodium chloride Last Rate: 75 mL/hr at 01/13/11 1058    Estimated Nutritional Needs:   Kcal:1575-1900 Protein:75-95g Fluid:1.5-1.9L  NUTRITION DIAGNOSIS: -Predicted suboptimal energy intake (NI-1.6).  Status: Ongoing -Pt underweight for age and build r/t BMI of 23 and thin extremities, unsure of weight trend and intake PTA to meet criteria for malnutrition   RELATED TO: poor appetite PTA  AS EVIDENCE BY: family statement in H&P  MONITORING/EVALUATION(Goals): Pt to consume >75% of  meals/supplements.  EDUCATION NEEDS: -Education not appropriate at this time  INTERVENTION: Nursing to continue to encourage excellent meal and supplement intake. RD to monitor intake.   Dietitian # [phone]  DOCUMENTATION CODES Per approved criteria  -Underweight    Marshall Cork 01/13/2011, 3:01 PM

## 2011-01-13 NOTE — Progress Notes (Signed)
Pt K level is 3.3. Dr. notified

## 2011-01-13 NOTE — Consults (Signed)
Consult Note from the Palliative Medicine Team at [facility]  Consult Requested by: Ramiro Harvest     PCP: Gaye Alken, MD Reason for Consultation: Goals of care     Phone Number:[phone]  Assessment and Plan: Pneumonia Questionable influenza Dementia Atrial fibrillation UTI Diabetes mellitus  Discussed in detail with the patient's granddaughter and daughter, at this time they would like to continue aggressive treatment with IV antibiotics. They would like her to get physical therapy, and also would like to find a skilled nursing facility for the patient. Patient will benefit from palliative care follow up at [facility].  1. Code Status: DNR 2. Symptom Control: Constipation- miralax 3. Psycho/Social: Daughter, Granddaughter are supportive. 4. Spiritual: No spiritual needs at this time 5. Disposition: SNF with palliative care follow up  Patient Documents Completed or Given: Document Given Completed  Advanced Directives Pkt    MOST    DNR    Gone from My [facility]ight    Hard Choices      Brief HPI: 77 year old female who was admitted with chief complaints of lethargy and cough, patient was found to have hyperglycemic episodes at the assisted-living facility. Patient was admitted diagnosis of possible pneumonia and was started on vancomycin and Zosyn. She does have history of atrial fibrillation and is currently on warfarin. She also has UTI the urine culture pending at this time, the goals of care meeting was done with patient's daughter and granddaughter in the room, discussed comfort care versus aggressive care approach, CODE STATUS, artificial nutrition or hydration.  Patient has a history of dementia, though she has been able to walk without a walker, feeds herself, able to use bathroom.FAST score - 6a.       PMH:  Past Medical History  Diagnosis Date  . Diabetes mellitus   . Hypertension   . Atrial fibrillation   . Dementia   . Cancer 1970's; 2005    colon  .  Hypercholesterolemia   . Heart murmur   . UTI (lower urinary tract infection)     "it does reoccur"  . Pneumonia   . COPD (chronic obstructive pulmonary disease) 01/12/11  . Bronchitis 2007     PSH: Past Surgical History  Procedure Date  . Colon surgery   . Colon resection 1970's; 2005  . Cataract extraction     "one eye; don't know which; don't know if they put lens in"   I have reviewed the FH and SH and  If appropriate update it with new information. Allergies  Allergen Reactions  . Codeine Other (See Comments)    Per MAR   Scheduled Meds:   . alum & mag hydroxide-simeth  30 mL Oral Daily  . dextrose      . digoxin  125 mcg Oral Q M,W,F  . ENSURE  237 mL Oral BID BM  . guaiFENesin  600 mg Oral BID  . metoprolol succinate  25 mg Oral BID  . oseltamivir  75 mg Oral Daily  . pantoprazole  40 mg Oral Daily  . piperacillin-tazobactam (ZOSYN)  IV  2.25 g Intravenous Q6H  . piperacillin-tazobactam (ZOSYN)  IV  3.375 g Intravenous Once  . potassium chloride  10 mEq Oral Daily  . potassium chloride  40 mEq Oral BID  . sodium chloride  3 mL Intravenous Q12H  . vancomycin  750 mg Intravenous Q24H  . vancomycin  1,000 mg Intravenous Once  . warfarin  5 mg Oral ONCE-1800  . warfarin  7.5 mg Oral Once  .  DISCONTD: albuterol  2.5 mg Nebulization Q6H  . DISCONTD: albuterol  2.5 mg Nebulization TID  . DISCONTD: enoxaparin  40 mg Subcutaneous Q24H  . DISCONTD: furosemide  120 mg Oral Q0700  . DISCONTD: furosemide  80 mg Oral QPM  . DISCONTD: ipratropium  0.5 mg Nebulization Q6H  . DISCONTD: ipratropium  0.5 mg Nebulization TID  . DISCONTD: polyethylene glycol  17 g Oral Daily  . DISCONTD: warfarin  5 mg Oral See admin instructions   Continuous Infusions:   . sodium chloride 75 mL/hr at 01/13/11 1058   PRN Meds:.sodium chloride, acetaminophen, albuterol, albuterol, ipratropium, ondansetron (ZOFRAN) IV, ondansetron, oxyCODONE, polyethylene glycol, senna-docusate, sodium  chloride, DISCONTD: alum & mag hydroxide-simeth    BP 108/64  Pulse 81  Temp(Src) 97.5 F (36.4 C) (Axillary)  Resp 24  Ht 5\' 5"  (1.651 m)  Wt 63.1 kg (139 lb 1.8 oz)  BMI 23.15 kg/m2  SpO2 96%   PPS: 30%   Intake/Output Summary (Last 24 hours) at 01/13/11 1323 Last data filed at 01/13/11 1056  Gross per 24 hour  Intake    326 ml  Output    725 ml  Net   -399 ml   LBM:  none                    Stool Softner: Miralax  Physical Exam:  General: Alert but not oriented HEENT: atraumatic, normocephalic Chest: Clear bilaterally CVS: S1S2 RRR Abdomen: Soft Ext: No edema   Labs: CBC    Component Value Date/Time   WBC 3.7* 01/13/2011 0620   WBC 3.7* 10/16/2006 1144   RBC 3.10* 01/13/2011 0620   RBC 3.25* 10/16/2006 1144   HGB 8.8* 01/13/2011 0620   HGB 10.4* 10/16/2006 1144   HCT 26.9* 01/13/2011 0620   HCT 28.5* 10/16/2006 1144   PLT 149* 01/13/2011 0620   PLT 255 10/16/2006 1144   MCV 86.8 01/13/2011 0620   MCV 87.9 10/16/2006 1144   MCH 28.4 01/13/2011 0620   MCH 31.9 10/16/2006 1144   MCHC 32.7 01/13/2011 0620   MCHC 36.3* 10/16/2006 1144   RDW 16.7* 01/13/2011 0620   RDW 16.4* 10/16/2006 1144   LYMPHSABS 0.6* 01/13/2011 0620   LYMPHSABS 0.5* 10/16/2006 1144   MONOABS 0.2 01/13/2011 0620   MONOABS 0.2 10/16/2006 1144   EOSABS 0.0 01/13/2011 0620   EOSABS 0.0 10/16/2006 1144   BASOSABS 0.0 01/13/2011 0620   BASOSABS 0.0 10/16/2006 1144    BMET    Component Value Date/Time   NA 142 01/13/2011 0620   K 3.3* 01/13/2011 0620   CL 107 01/13/2011 0620   CO2 26 01/13/2011 0620   GLUCOSE 60* 01/13/2011 0620   BUN 33* 01/13/2011 0620   CREATININE 1.44* 01/13/2011 0620   CALCIUM 8.1* 01/13/2011 0620   GFRNONAA 29* 01/13/2011 0620   GFRAA 34* 01/13/2011 0620    CMP     Component Value Date/Time   NA 142 01/13/2011 0620   K 3.3* 01/13/2011 0620   CL 107 01/13/2011 0620   CO2 26 01/13/2011 0620   GLUCOSE 60* 01/13/2011 0620   BUN 33* 01/13/2011 0620    CREATININE 1.44* 01/13/2011 0620   CALCIUM 8.1* 01/13/2011 0620   PROT 5.0* 06/24/2006 0300   ALBUMIN 1.8* 06/24/2006 0300   AST 31 06/24/2006 0300   ALT 30 06/24/2006 0300   ALKPHOS 110 06/24/2006 0300   BILITOT 1.9* 06/24/2006 0300   GFRNONAA 29* 01/13/2011 0620   GFRAA 34* 01/13/2011 1610  CHEST - 2 VIEW  Comparison: 01/28/2009.  Findings: Cardiomegaly with mild vascular congestion. Retrocardiac  density on the right appears increased from priors along with  slight prominence of what may be a right hilar lymph nodes. Early  right lower lobe pneumonia not excluded. No effusion or  pneumothorax. Bones unremarkable.  IMPRESSION:  Cardiomegaly with mild vascular congestion. Cannot exclude early  right lower lobe infiltrate.     Time In Time Out Total Time Spent with Patient Total Overall Time         Greater than 50%  of this time was spent counseling and coordinating care related to the above assessment and plan.

## 2011-01-13 NOTE — Progress Notes (Signed)
Subjective: Patient has no complaints at this time. Objective: Filed Vitals:   01/12/11 2035 01/13/11 0511 01/13/11 0800 01/13/11 0815  BP: 104/64 103/63 105/61   Pulse: 93 54 74   Temp: 97.3 F (36.3 C) 97.5 F (36.4 C)    TempSrc:      Resp: 20 18 24    Height:      Weight:  63.1 kg (139 lb 1.8 oz)    SpO2: 97% 99% 99% 96%   Weight change:   Intake/Output Summary (Last 24 hours) at 01/13/11 1032 Last data filed at 01/13/11 0800  Gross per 24 hour  Intake    323 ml  Output    725 ml  Net   -402 ml    General: Alert, awake, oriented x1, in no acute distress.  HEENT: No bruits, no goiter.  Heart: Regular rate and rhythm, without murmurs, rubs, gallops.  Lungs: Crackles right side, bilateral air movement.  Abdomen: Soft, nontender, nondistended, positive bowel sounds.  Neuro: Grossly intact, nonfocal.   Lab Results:  Basename 01/13/11 0620 01/12/11 1536 01/12/11 1004  NA 142 -- 136  K 3.3* -- 3.0*  CL 107 -- 99  CO2 26 -- 27  GLUCOSE 60* -- 158*  BUN 33* -- 35*  CREATININE 1.44* 1.30* --  CALCIUM 8.1* -- 8.1*  MG -- 1.9 --  PHOS -- -- --    Basename 01/13/11 0620 01/12/11 1536  WBC 3.7* 5.4  NEUTROABS 2.9 --  HGB 8.8* 9.5*  HCT 26.9* 28.9*  MCV 86.8 85.8  PLT 149* 141*    Basename 01/12/11 1536  HGBA1C 6.6*   Micro Results: No results found for this or any previous visit (from the past 240 hour(s)).  Studies/Results: Dg Chest 2 View  01/12/2011  *RADIOLOGY REPORT*  Clinical Data: Cough with shortness of breath.  CHEST - 2 VIEW  Comparison: 01/28/2009.  Findings: Cardiomegaly with mild vascular congestion.  Retrocardiac density on the right appears increased from priors along with slight prominence of what may be a right hilar lymph nodes.  Early right lower lobe pneumonia not excluded.  No effusion or pneumothorax.  Bones unremarkable.  IMPRESSION: Cardiomegaly with mild vascular congestion.  Cannot exclude early right lower lobe infiltrate.  Original  Report Authenticated By: Elsie Stain, M.D.    Medications: I have reviewed the patient's current medications.   Principal Problem:  *HCAP (healthcare-associated pneumonia) Active Problems:  Hypokalemia  UTI (lower urinary tract infection)  Diabetes mellitus  A-fib  HTN (hypertension)  Dementia  Hypoglycemia  Encephalopathy acute  Anemia  Acute renal failure    Assessment and plan: -The patient continues to spike fever despite being on vancomycin, Zosyn, will check and influenza  PCR start her on Tamiflu she does not have a white count. But hasn't ongoing cough. Her blood pressure has remained stable. Urine cultures and sputum cultures are pending  -she still hypokalemic we'll replete, this probably secondary to dehydration, will stop her Lasix give her IV fluids check a basic metabolic panel in the morning. Her feeding is less than 1% even on a diuretic.  -Her blood glucose was well controlled. Do not use sliding scale insulin on this patient.  -Document she has a UTI only for urine cultures.  -She's currently sinus rhythm and rate control.  -Her blood pressure is currently well controlled.     LOS: 1 day   Marinda Elk M.D. Pager: [phone] [facility] 01/13/2011, 10:32 AM

## 2011-01-13 NOTE — Progress Notes (Signed)
ANTICOAGULATION CONSULT NOTE - Follow Up  Pharmacy Consult:  Warfarin  Indication: atrial fibrillation  Admit Complaint:  Lethargy, cough Pharmacist System-Based Medication Review:  Anticoagulation:  Coumadin for chronic Afib, INR therapeutic at 2.16, no bleeding noted. Infectious Disease:  Vanc/Zosyn Rx#2 for r/o PNA + Tamiflu MD#1 for r/o H.flu, afebrile today, WBC WNL Cardiovascular:  HTN/Afib - 108/64, HR 61 - digoxin, Toprol Endocrinology:  DM, A1c 6.6 - CBGs 60-233 Gastrointestinal / Nutrition:  Carb modified diet.  Neurology:  Dementia - not on meds Nephrology:  SCr 1.44, CrCL 20 ml/min, K+ 3.3 - supplementing Pulmonary:  96% 2L [place], Mucinex Hematology / Oncology:  hgb 8.8, plts 149 - low Fe/TIBC Best Practices:  Coumadin, PPI PO  Home Coumadin dose:  5mg   MoTuWeFrSa, nothing on Thurs and Sun  Goal of Therapy:  INR 2-3, vanc trough 15-20 mcg/mL   Plan:  Coumadin 5mg  PO today Continue vanc 750mg  IV Q24H and Zosyn 2.25gm IV Q6H Monitor renal fxn, INR F/U iron supplementation, monitor CBGs  Phillips Climes, PharmD 01/13/2011    Allergies  Allergen Reactions  . Codeine Other (See Comments)    Per [facility]    Patient Measurements: Height: 5\' 5"  (165.1 cm) Weight: 139 lb 1.8 oz (63.1 kg) (bed wt) IBW/kg (Calculated) : 57   Vital Signs: Temp: 97.5 F (36.4 C) (12/22 0511) BP: 108/64 mmHg (12/22 1109) Pulse Rate: 81  (12/22 1109)  Labs:  Basename 01/13/11 0620 01/12/11 1536 01/12/11 1004  HGB 8.8* 9.5* --  HCT 26.9* 28.9* 32.1*  PLT 149* 141* 168  APTT 56* 42* --  LABPROT -- 21.6* --  INR -- 1.84* --  HEPARINUNFRC -- -- --  CREATININE 1.44* 1.30* 1.34*  CKTOTAL -- -- --  CKMB -- -- --  TROPONINI -- -- --   Estimated Creatinine Clearance: 19.6 ml/min (by C-G formula based on Cr of 1.44).  Medical History: Past Medical History  Diagnosis Date  . Diabetes mellitus   . Hypertension   . Atrial fibrillation   . Dementia   . Cancer 1970's; 2005    colon  .  Hypercholesterolemia   . Heart murmur   . UTI (lower urinary tract infection)     "it does reoccur"  . Pneumonia   . COPD (chronic obstructive pulmonary disease) 01/12/11  . Bronchitis 2007

## 2011-01-14 LAB — GLUCOSE, CAPILLARY: Glucose-Capillary: 204 mg/dL — ABNORMAL HIGH (ref 70–99)

## 2011-01-14 LAB — BASIC METABOLIC PANEL
CO2: 27 mEq/L (ref 19–32)
Calcium: 8.3 mg/dL — ABNORMAL LOW (ref 8.4–10.5)
Creatinine, Ser: 1.46 mg/dL — ABNORMAL HIGH (ref 0.50–1.10)
GFR calc Af Amer: 33 mL/min — ABNORMAL LOW (ref >90)
GFR calc non Af Amer: 29 mL/min — ABNORMAL LOW (ref >90)
Sodium: 142 mEq/L (ref 135–145)

## 2011-01-14 LAB — PROTIME-INR
INR: 2.81 — ABNORMAL HIGH (ref 0.00–1.49)
Prothrombin Time: 30 seconds — ABNORMAL HIGH (ref 11.6–15.2)

## 2011-01-14 MED ORDER — LEVOFLOXACIN 500 MG PO TABS
500.0000 mg | ORAL_TABLET | Freq: Every day | ORAL | Status: DC
  Administered 2011-01-14 – 2011-01-15 (×2): 500 mg via ORAL
  Filled 2011-01-14 (×2): qty 1

## 2011-01-14 MED ORDER — MOXIFLOXACIN HCL IN NACL 400 MG/250ML IV SOLN: 400.0000 mg | INTRAVENOUS | Status: DC

## 2011-01-14 MED ORDER — LEVOFLOXACIN 500 MG PO TABS: 500.0000 mg | ORAL_TABLET | Freq: Every day | ORAL | Status: DC

## 2011-01-14 MED ORDER — BIOTENE DRY MOUTH MT LIQD
15.0000 mL | Freq: Two times a day (BID) | OROMUCOSAL | Status: DC
  Administered 2011-01-15 – 2011-01-18 (×5): 15 mL via OROMUCOSAL

## 2011-01-14 MED ORDER — MOXIFLOXACIN HCL 400 MG PO TABS: 400.0000 mg | ORAL_TABLET | Freq: Every day | ORAL | Status: DC

## 2011-01-14 NOTE — Progress Notes (Signed)
ANTICOAGULATION CONSULT NOTE - Follow Up  Pharmacy Consult:  Warfarin  Indication: atrial fibrillation  Admit Complaint:  Lethargy, cough Pharmacist System-Based Medication Review:  Anticoagulation:  Coumadin for chronic Afib, INR therapeutic at 2.81 but has increased significantly, no bleeding noted. Infectious Disease:  Vanc/Zosyn Rx#3 for r/o PNA and E.coli UTI + Tamiflu MD21 for r/o H.flu, afebrile today, WBC WNL Cardiovascular:  HTN/Afib - VSS - digoxin, Toprol Endocrinology:  DM, A1c 6.6 - CBGs better controlled Gastrointestinal / Nutrition:  Carb modified diet.  Neurology:  Dementia - not on meds Nephrology:  SCr 1.46, CrCL 19 ml/min, lytes WNL Pulmonary:  99% 2L [place], Mucinex Hematology / Oncology:  hgb 8.8, plts 149 - low Fe/TIBC Best Practices:  Coumadin, PPI PO  Home Coumadin dose:  5mg   MoTuWeFrSa, nothing on Thurs and Sun  Goal of Therapy:  INR 2-3, vanc trough 15-20 mcg/mL   Plan:  Will hold Coumadin today Continue vanc 750mg  IV Q24H and Zosyn 2.25gm IV Q6H Monitor renal fxn, INR, C/S F/U iron supplementation, plan of care  Phillips Climes, PharmD 01/14/2011    Allergies  Allergen Reactions  . Codeine Other (See Comments)    Per [facility]    Patient Measurements: Height: 5\' 5"  (165.1 cm) Weight: 140 lb 6.9 oz (63.7 kg) (Bed Scale) IBW/kg (Calculated) : 57   Vital Signs: Temp: 97.2 F (36.2 C) (12/23 0621) Temp src: Oral (12/23 0621) BP: 124/52 mmHg (12/23 0621) Pulse Rate: 99  (12/23 0621)  Labs:  Basename 01/14/11 0625 01/13/11 0620 01/12/11 1536 01/12/11 1004  HGB -- 8.8* 9.5* --  HCT -- 26.9* 28.9* 32.1*  PLT -- 149* 141* 168  APTT -- 56* 42* --  LABPROT 30.0* 24.5* 21.6* --  INR 2.81* 2.16* 1.84* --  HEPARINUNFRC -- -- -- --  CREATININE 1.46* 1.44* 1.30* --  CKTOTAL -- -- -- --  CKMB -- -- -- --  TROPONINI -- -- -- --   Estimated Creatinine Clearance: 19.4 ml/min (by C-G formula based on Cr of 1.46).  Medical History: Past Medical History    Diagnosis Date  . Diabetes mellitus   . Hypertension   . Atrial fibrillation   . Dementia   . Cancer 1970's; 2005    colon  . Hypercholesterolemia   . Heart murmur   . UTI (lower urinary tract infection)     "it does reoccur"  . Pneumonia   . COPD (chronic obstructive pulmonary disease) 01/12/11  . Bronchitis 2007

## 2011-01-14 NOTE — Progress Notes (Signed)
Physical Therapy Evaluation Patient Details Name: [name] MRN: [id number] DOB: 1910-11-11 Today's Date: 01/14/2011  Problem List:  Patient Active Problem List  Diagnoses  . HCAP (healthcare-associated pneumonia)  . UTI (lower urinary tract infection)  . Diabetes mellitus  . A-fib  . HTN (hypertension)  . Depression  . Dementia  . Cancer, colon  . Encephalopathy acute  . Anemia  . Acute renal failure    Past Medical History:  Past Medical History  Diagnosis Date  . Diabetes mellitus   . Hypertension   . Atrial fibrillation   . Dementia   . Cancer 1970's; 2005    colon  . Hypercholesterolemia   . Heart murmur   . UTI (lower urinary tract infection)     "it does reoccur"  . Pneumonia   . COPD (chronic obstructive pulmonary disease) 01/12/11  . Bronchitis 2007   Past Surgical History:  Past Surgical History  Procedure Date  . Colon surgery   . Colon resection 1970's; 2005  . Cataract extraction     "one eye; don't know which; don't know if they put lens in"    PT Assessment/Plan/Recommendation PT Assessment Clinical Impression Statement: Patient is a 77 yo female admitted with pneumonia.  Patient with general weakness and decreased cognition, impacting functional mobility.  Patient requiring moderate assistance for all mobility. Recommend SNF at discharge for continued therapy. PT Recommendation/Assessment: Patient will need skilled PT in the acute care venue PT Problem List: Decreased strength;Decreased activity tolerance;Decreased balance;Decreased mobility;Decreased cognition;Decreased knowledge of use of DME;Decreased safety awareness;Cardiopulmonary status limiting activity Barriers to Discharge: Decreased caregiver support PT Therapy Diagnosis : Difficulty walking;Generalized weakness;Altered mental status PT Plan PT Frequency: Min 2X/week PT Treatment/Interventions: DME instruction;Gait training;Functional mobility training;Balance training;Cognitive  remediation;Patient/family education PT Recommendation Follow Up Recommendations: Skilled nursing facility Equipment Recommended: Defer to next venue PT Goals  Acute Rehab PT Goals PT Goal Formulation: Patient unable to participate in goal setting Time For Goal Achievement: 2 weeks Pt will Roll Supine to Right Side: with supervision PT Goal: Rolling Supine to Right Side - Progress: Not met Pt will Roll Supine to Left Side: with supervision PT Goal: Rolling Supine to Left Side - Progress: Not met Pt will go Supine/Side to Sit: with supervision;with HOB 0 degrees PT Goal: Supine/Side to Sit - Progress: Not met Pt will go Sit to Supine/Side: with supervision;with HOB 0 degrees PT Goal: Sit to Supine/Side - Progress: Not met Pt will go Sit to Stand: with min assist PT Goal: Sit to Stand - Progress: Not met Pt will go Stand to Sit: with min assist PT Goal: Stand to Sit - Progress: Not met Pt will Transfer Bed to Chair/Chair to Bed: with min assist PT Transfer Goal: Bed to Chair/Chair to Bed - Progress: Not met Pt will Ambulate: 51 - 150 feet;with min assist;with rolling walker PT Goal: Ambulate - Progress: Not met  PT Evaluation Precautions/Restrictions  Precautions Precautions: Fall Required Braces or Orthoses: No Restrictions Weight Bearing Restrictions: No Prior Functioning  Home Living Lives With: Alone Type of Home: Assisted living (Patient unable to provide info.) Prior Function Level of Independence:  (Per medical record) Driving: No Vocation: Retired Financial risk analyst Arousal/Alertness: Awake/alert Overall Cognitive Status: Impaired Attention: Impaired Current Attention Level: Sustained Attention - Other Comments: Frequent redirection to task.  Patient perseverating on need to void (has foley catheter) Memory: Appears impaired Memory Deficits: Unable to state where she lives Orientation Level: Oriented to person;Disoriented to place;Disoriented to  time;Disoriented to situation (states  she is at an apartment. Needed reorientation) Following Commands: Follows one step commands consistently (Visual cues needed due to hearing deficits) Safety/Judgement: Decreased awareness of safety precautions (Fearful of falling when standing) Awareness of Deficits: Decreased awareness of deficits Awareness of Deficits - Other Comments: Unable to state why she is in hospital Sensation/Coordination Coordination Gross Motor Movements are Fluid and Coordinated: Yes Extremity Assessment RLE Assessment RLE Assessment: Exceptions to [facility] (Grossly 4-/5.  General weakness throughout) LLE Assessment LLE Assessment: Exceptions to [facility] (Grossly 4-/5; General weakness throughout) Mobility (including Balance) Bed Mobility Bed Mobility: Yes Supine to Sit: 3: Mod assist;HOB elevated (Comment degrees) (HOB at 40 degrees) Supine to Sit Details (indicate cue type and reason): Tactile and verbal cues for technique.  Assist to raise trunk from bed Sitting - Scoot to Edge of Bed: 4: Min assist Sitting - Scoot to [place] of Bed Details (indicate cue type and reason): Physical cues to move one hip forward at a time Sit to Supine - Left: 3: Mod assist;HOB elevated (comment degrees) (HOB at 40 degrees) Sit to Supine - Left Details (indicate cue type and reason): Cues to use UE's to assist.  Assist to move LE's on to bed Transfers Transfers: Yes Sit to Stand: 3: Mod assist;With upper extremity assist;From bed Sit to Stand Details (indicate cue type and reason): Verbal and tactile cues for hand placement.  Patient leaning posteriorly with fair balance in standing, requiring mod assist to shift weight forward Stand to Sit: 3: Mod assist;With upper extremity assist;To bed Stand to Sit Details: Cues for hand placement and safe descent to bed. Ambulation/Gait Ambulation/Gait: No (Patient unwilling to walk - fearful of falling)  Posture/Postural Control Posture/Postural Control:  Postural limitations Postural Limitations: Thoracic kyphosis; flexed trunk in sitting and standing Balance Balance Assessed: Yes Static Sitting Balance Static Sitting - Balance Support: Bilateral upper extremity supported;Feet supported Static Sitting - Level of Assistance: 4: Min assist Static Sitting - Comment/# of Minutes: Able to sit 7 minutes with occasional min assist for balance. Static Standing Balance Static Standing - Balance Support: Bilateral upper extremity supported Static Standing - Level of Assistance: 3: Mod assist Static Standing - Comment/# of Minutes: Stood 2 minutes with posterior lean.  Mod assist to shift weight forward. Exercise    End of Session PT - End of Session Equipment Utilized During Treatment: Gait belt Activity Tolerance: Patient limited by fatigue;Other (comment) (Patient limited by cognition) Patient left: in bed;with call bell in reach Nurse Communication: Mobility status for transfers General Behavior During Session: [facility] for tasks performed Cognition: Impaired  Vena Austria [phone] 01/14/2011, 4:05 PM

## 2011-01-14 NOTE — Progress Notes (Signed)
CSW covering for the weekend spoke with the pts daughter on the phone and completed the psychosocial assessment (pls see shadow chart). Pts daughter reports pt has been a resident of [facility] for almost 2 years and she feels as though the pt needs a higher level of care. CSW assigned to the pt will continue to follow and offer support.  Patrice Paradise, LCSWA 01/14/2011 3:22 PM [phone]

## 2011-01-14 NOTE — Progress Notes (Signed)
Subjective: Patient has no complaints at this time. Objective: Filed Vitals:   01/13/11 1109 01/13/11 1504 01/13/11 2106 01/14/11 0621  BP: 108/64 115/57 125/55 124/52  Pulse: 81 81 99 99  Temp:  97 F (36.1 C) 97.6 F (36.4 C) 97.2 F (36.2 C)  TempSrc:  Oral Axillary Oral  Resp:  18 18 18   Height:      Weight:    63.7 kg (140 lb 6.9 oz)  SpO2:  95% 98% 99%   Weight change: -2.072 kg (-4 lb 9.1 oz)  Intake/Output Summary (Last 24 hours) at 01/14/11 1336 Last data filed at 01/14/11 1215  Gross per 24 hour  Intake 1686.75 ml  Output   1200 ml  Net 486.75 ml    General: Alert, awake, oriented x1, in no acute distress.  HEENT: No bruits, no goiter.  Heart: Regular rate and rhythm, without murmurs, rubs, gallops.  Lungs: Good air movement, CTA B/L Abdomen: Soft, nontender, nondistended, positive bowel sounds.  Neuro: Grossly intact, nonfocal.   Lab Results:  [facility] 01/14/11 0625 01/13/11 0620 01/12/11 1536  NA 142 142 --  K 4.0 3.3* --  CL 107 107 --  CO2 27 26 --  GLUCOSE 114* 60* --  BUN 27* 33* --  CREATININE 1.46* 1.44* --  CALCIUM 8.3* 8.1* --  MG -- -- 1.9  PHOS -- -- --    Basename 01/13/11 0620 01/12/11 1536  WBC 3.7* 5.4  NEUTROABS 2.9 --  HGB 8.8* 9.5*  HCT 26.9* 28.9*  MCV 86.8 85.8  PLT 149* 141*    Basename 01/12/11 1536  HGBA1C 6.6*   Micro Results: Recent Results (from the past 240 hour(s))  URINE CULTURE     Status: Normal (Preliminary result)   Collection Time   01/12/11  4:37 PM      Component Value Range Status Comment   Specimen Description URINE, CLEAN CATCH   Final    Special Requests NONE   Final    Setup Time [id number]   Final    Colony Count >=100,000 COLONIES/ML   Final    Culture ESCHERICHIA COLI   Final    Report Status PENDING   Incomplete     Studies/Results: No results found.  Medications: I have reviewed the patient's current medications.   Principal Problem:  *HCAP (healthcare-associated  pneumonia) Active Problems:  Hypokalemia  UTI (lower urinary tract infection)  Diabetes mellitus  A-fib  HTN (hypertension)  Dementia  Hypoglycemia  Encephalopathy acute  Anemia  Acute renal failure    Assessment and plan: -vancomycin, Zosyn day 2,influenza  PCR -. But hasn't ongoing cough. Her blood pressure has remained stable. We'll change antibiotics to pills will DC home tomorrow.  -Normal potassium, her fractional excretion of sodium is less than 1%, and IV fluid, continues to be positive about a liter  -UA shows 7-10 white blood cells. Urine culture pending  -She's currently sinus rhythm and rate control.       LOS: 2 days   Marinda Elk M.D. Pager: [phone] [facility] 01/14/2011, 1:36 PM

## 2011-01-15 LAB — GLUCOSE, CAPILLARY
Glucose-Capillary: 135 mg/dL — ABNORMAL HIGH (ref 70–99)
Glucose-Capillary: 155 mg/dL — ABNORMAL HIGH (ref 70–99)
Glucose-Capillary: 125 mg/dL — ABNORMAL HIGH (ref 70–99)

## 2011-01-15 LAB — URINE CULTURE: Culture  Setup Time: 201212211705

## 2011-01-15 LAB — PROTIME-INR: INR: 2.65 — ABNORMAL HIGH (ref 0.00–1.49)

## 2011-01-15 MED ORDER — WARFARIN SODIUM 5 MG PO TABS
5.0000 mg | ORAL_TABLET | Freq: Once | ORAL | Status: AC
  Administered 2011-01-15: 5 mg via ORAL
  Filled 2011-01-15: qty 1

## 2011-01-15 MED ORDER — LEVOFLOXACIN 500 MG PO TABS: 500.0000 mg | ORAL_TABLET | Freq: Every day | ORAL | Status: AC

## 2011-01-15 MED ORDER — CEFUROXIME AXETIL 500 MG PO TABS: 500.0000 mg | ORAL_TABLET | Freq: Two times a day (BID) | ORAL | Status: AC

## 2011-01-15 MED ORDER — MOXIFLOXACIN HCL 400 MG PO TABS
400.0000 mg | ORAL_TABLET | Freq: Every day | ORAL | Status: DC
  Administered 2011-01-15 – 2011-01-16 (×2): 400 mg via ORAL
  Filled 2011-01-15 (×5): qty 1

## 2011-01-15 MED ORDER — GLYBURIDE 5 MG PO TABS
5.0000 mg | ORAL_TABLET | Freq: Two times a day (BID) | ORAL | Status: DC
  Administered 2011-01-15 – 2011-01-18 (×6): 5 mg via ORAL
  Filled 2011-01-15 (×9): qty 1

## 2011-01-15 MED ORDER — CEFUROXIME AXETIL 500 MG PO TABS
500.0000 mg | ORAL_TABLET | Freq: Two times a day (BID) | ORAL | Status: DC
  Administered 2011-01-15 – 2011-01-18 (×7): 500 mg via ORAL
  Filled 2011-01-15 (×9): qty 1

## 2011-01-15 MED ORDER — POTASSIUM CHLORIDE CRYS ER 20 MEQ PO TBCR
EXTENDED_RELEASE_TABLET | ORAL | Status: AC
  Administered 2011-01-16: 10 meq via ORAL
  Filled 2011-01-15: qty 1

## 2011-01-15 NOTE — Progress Notes (Signed)
Occupational Therapy Evaluation Patient Details Name: [name] MRN: [id number] DOB: [birth year] Today's Date: 01/15/2011  Problem List:  Patient Active Problem List  Diagnoses  . Diabetes mellitus  . A-fib  . HTN (hypertension)  . Depression  . Dementia  . Cancer, colon  . Anemia    Past Medical History:  Past Medical History  Diagnosis Date  . Diabetes mellitus   . Hypertension   . Atrial fibrillation   . Dementia   . Cancer 1970's; 2005    colon  . Hypercholesterolemia   . Heart murmur   . UTI (lower urinary tract infection)     "it does reoccur"  . Pneumonia   . COPD (chronic obstructive pulmonary disease) 01/12/11  . Bronchitis 2007   Past Surgical History:  Past Surgical History  Procedure Date  . Colon surgery   . Colon resection 1970's; 2005  . Cataract extraction     "one eye; don't know which; don't know if they put lens in"    OT Assessment/Plan/Recommendation OT Assessment Clinical Impression Statement: 77 yo female pleasant with PNA that could benefit from skilled OT acutely to maximize independence see list of problems below. OT Recommendation/Assessment: Patient will need skilled OT in the acute care venue OT Problem List: Decreased activity tolerance;Impaired balance (sitting and/or standing);Decreased cognition;Decreased safety awareness;Decreased knowledge of use of DME or AE;Decreased knowledge of precautions;Pain OT Therapy Diagnosis : Generalized weakness OT Plan OT Frequency: Min 1X/week OT Treatment/Interventions: DME and/or AE instruction;Cognitive remediation/compensation;Patient/family education;Balance training;Self-care/ADL training OT Recommendation Follow Up Recommendations: Skilled nursing facility Equipment Recommended: Defer to next venue Individuals Consulted Consulted and Agree with Results and Recommendations: Family member/caregiver;Patient OT Goals Acute Rehab OT Goals OT Goal Formulation: With patient/family Time  For Goal Achievement: 2 weeks ADL Goals Pt Will Perform Grooming: with set-up;Sitting, chair;Supported Pt Will Perform Upper Body Bathing: with set-up;Sitting, chair;Supported Pt Will Perform Lower Body Bathing: with mod assist;Sit to stand from chair;Sit to stand from bed Pt Will Perform Upper Body Dressing: with set-up;Sitting, chair;Sitting, bed;Supported Pt Will Perform Lower Body Dressing: with mod assist;Sit to stand from chair;Sit to stand from bed Pt Will Transfer to Toilet: with mod assist;3-in-1 Pt Will Perform Toileting - Clothing Manipulation: with min assist;Sitting on 3-in-1 or toilet Pt Will Perform Toileting - Hygiene: with mod assist;Sit to stand from 3-in-1/toilet  OT Evaluation Precautions/Restrictions  Precautions Precautions: Fall Required Braces or Orthoses: No Restrictions Weight Bearing Restrictions: No Prior Functioning Home Living Lives With: Alone Type of Home: Assisted living Prior Function Level of Independence: Independent with gait;Needs assistance with ADLs;Needs assistance with homemaking Driving: No Vocation: Retired ADL ADL ADL Comments: pt HOH and best to talk on pt's Lt side Vision/Perception    Cognition Cognition Arousal/Alertness: Awake/alert Overall Cognitive Status: Impaired Attention: Impaired Current Attention Level: Sustained Attention - Other Comments: Pt repeating needs impulsively and repeating again and again. Pt demonstrating poor problem solving and observation of staff assisting pt meet their needs. pt just continues to request until task fully completed. Memory: Appears impaired Orientation Level: Oriented to person Following Commands: Follows one step commands consistently Safety/Judgement: Decreased awareness of safety precautions Decreased Safety/Judgement: Impulsive Awareness of Deficits: Decreased awareness of deficits Sensation/Coordination Coordination Gross Motor Movements are Fluid and Coordinated: Yes Fine  Motor Movements are Fluid and Coordinated: Not tested Extremity Assessment RUE Assessment RUE Assessment: Within Functional Limits LUE Assessment LUE Assessment: Within Functional Limits Mobility  Bed Mobility Bed Mobility: Yes Supine to Sit: 3: Mod assist;With rails;HOB elevated (Comment degrees) (Hob =  30degrees) Supine to Sit Details (indicate cue type and reason): pt required tactile cues and verbal cues to complete task Sitting - Scoot to Edge of Bed: 3: Mod assist (with pad) Transfers Transfers: Yes Sit to Stand: 3: Mod assist;With upper extremity assist;From bed;From elevated surface (pt oxgyen 94% on RA ; HR 94) Sit to Stand Details (indicate cue type and reason): Pt with posterior lean and tiny shuffled steps to transfer to chair. Exercises   End of Session OT - End of Session Equipment Utilized During Treatment: Gait belt Activity Tolerance: Patient limited by fatigue Patient left: in bed;with call bell in reach;with family/visitor present (daughter granddaugther great grandchildren *2) Nurse Communication: Mobility status for transfers General Behavior During Session: [facility] for tasks performed Cognition: Impaired     Lucile Shutters 01/15/2011, 1:12 PM  Pager: [phone]

## 2011-01-15 NOTE — Progress Notes (Signed)
Utilization review completed. [name] 01/15/2011 

## 2011-01-15 NOTE — Progress Notes (Addendum)
Clinical Social Worker met with pt daughter at bedside to discuss pt d/c needs. Pt daughter agreeable to SNF search in [place]. Clinical Child psychotherapist completed FL-2 and initiated SNF search in [place]. Clinical Social Worker to submit pt clinical information to pt insurance as pt insurance requires authorization for SNF prior to pt d/c to SNF. Clinical Social Worker to facilitate pt D/C needs when bed available and insurance authorization received which may take a few days due to the holiday and facilities and insurance companies being closed.   Jacklynn Lewis, MSW, LCSWA  Clinical Social Work [phone]

## 2011-01-15 NOTE — Discharge Summary (Addendum)
Admit date: 01/12/2011 Discharge date: 01/18/2011  Primary Care Physician:  Gaye Alken, MD   Discharge Diagnoses:   Active Hospital Problems  Diagnoses Date Noted   . Diabetes mellitus 01/12/2011   . A-fib 01/12/2011   . HTN (hypertension) 01/12/2011   . Dementia 01/12/2011   . Anemia 01/12/2011     Resolved Hospital Problems  Diagnoses Date Noted Date Resolved  . HCAP (healthcare-associated pneumonia) 01/12/2011 01/15/2011  . Acute renal failure 01/13/2011 01/15/2011  . Hypokalemia 01/12/2011 01/14/2011  . UTI (lower urinary tract infection) 01/12/2011 01/15/2011  . Hypoglycemia 01/12/2011 01/14/2011  . Encephalopathy acute 01/12/2011 01/15/2011     DISCHARGE MEDICATION: Current Discharge Medication List    START taking these medications   Details  cefUROXime (CEFTIN) 500 MG tablet Take 1 tablet (500 mg total) by mouth 2 (two) times daily with a meal. Qty: 10 tablet, Refills: 0    ferrous sulfate 325 (65 FE) MG tablet Take 1 tablet (325 mg total) by mouth 3 (three) times daily with meals.    levofloxacin (LEVAQUIN) 500 MG tablet Take 1 tablet (500 mg total) by mouth daily. Qty: 5 tablet, Refills: 0      CONTINUE these medications which have NOT CHANGED   Details  cholecalciferol (VITAMIN D) 1000 UNITS tablet Take 2,000 Units by mouth daily.      digoxin (LANOXIN) 0.125 MG tablet Take 125 mcg by mouth every Monday, Wednesday, and Friday.      ENSURE (ENSURE) Take 237 mLs by mouth 2 (two) times daily between meals.      !! furosemide (LASIX) 40 MG tablet Take 120 mg by mouth every morning.      !! furosemide (LASIX) 80 MG tablet Take 80 mg by mouth every evening.      glyBURIDE (DIABETA) 5 MG tablet Take 5 mg by mouth 2 (two) times daily with a meal.      metoprolol succinate (TOPROL-XL) 25 MG 24 hr tablet Take 25 mg by mouth 2 (two) times daily.      mirtazapine (REMERON) 15 MG tablet Take 15 mg by mouth at bedtime.      pantoprazole (PROTONIX)  40 MG tablet Take 40 mg by mouth daily.      polyethylene glycol (MIRALAX / GLYCOLAX) packet Take 17 g by mouth daily.      potassium chloride (KLOR-CON) 10 MEQ CR tablet Take 10 mEq by mouth 2 (two) times daily.      warfarin (COUMADIN) 5 MG tablet Take 5 mg by mouth See admin instructions. Takes on m,tue,wed,fri,sat    acetaminophen (TYLENOL) 325 MG tablet Take 650 mg by mouth 3 (three) times daily as needed. For pain      !! - Potential duplicate medications found. Please discuss with provider.         Consults: Treatment Team:  Palliative [facility]   SIGNIFICANT DIAGNOSTIC STUDIES:  Dg Chest 2 View  01/12/2011  *RADIOLOGY REPORT*  Clinical Data: Cough with shortness of breath.  CHEST - 2 VIEW  Comparison: 01/28/2009.  Findings: Cardiomegaly with mild vascular congestion.  Retrocardiac density on the right appears increased from priors along with slight prominence of what may be a right hilar lymph nodes.  Early right lower lobe pneumonia not excluded.  No effusion or pneumothorax.  Bones unremarkable.  IMPRESSION: Cardiomegaly with mild vascular congestion.  Cannot exclude early right lower lobe infiltrate.  Original Report Authenticated By: Elsie Stain, M.D.     Recent Results (from the past 240 hour(s))  URINE CULTURE     Status: Normal   Collection Time   01/12/11  4:37 PM      Component Value Range Status Comment   Specimen Description URINE, CLEAN CATCH   Final    Special Requests NONE   Final    Setup Time [id number]   Final    Colony Count >=100,000 COLONIES/ML   Final    Culture ESCHERICHIA COLI   Final    Report Status 01/15/2011 FINAL   Final    Organism ID, Bacteria ESCHERICHIA COLI   Final     BRIEF ADMITTING H & P:  [name] is a 77yo WF NURSING home resident in a dementia unit, with a history of dementia, type 2 diabetes, hypertension, atrial fibrillation on chronic Coumadin therapy who presents to the ED with 2 episodes of lethargy and  decreased responsiveness and listlessness felt to be secondary to hypoglycemia with CBGs in the 40s. Patient also with complaints of the 2 day history of non-productive cough. Patient also has decreased appetite. Per family patient with emesis 2 days prior to admission. Patient does have dementia is following commands however history is unreliable and most of the history is obtained from her family was at bedside. Patient and family deny any fever no chills, no nausea, no abdominal pain, no diarrhea, no chest pain, no shortness of breath, no weakness. Patient does endorse some dysuria and decreased appetite and constipation. Patient was seen in the emergency room chest x-ray which was done showed a probable early basilar pneumonia. CBC which was done did have a hemoglobin of 10.4 white count of 6.3 otherwise was within normal limits. Be met which was obtained did show a potassium of 3.0 BUN of 35 and a creatinine of 1.34 with a calcium of 8.1. Urinalysis which was done did have large leukocytes. Patient was given some IV vancomycin and Zosyn will call to admit the patient for further evaluation and management.  Active Hospital Problems  Diagnoses Date Noted   . Diabetes mellitus: No changes in  01/12/2011   . A-fib: She remained rate control, INR therapeutic no changes were made  01/12/2011   . HTN (hypertension): No changes were made  01/12/2011     Resolved Hospital Problems  Diagnoses Date Noted Date Resolved  . HCAP (healthcare-associated pneumonia): Came from skilled nursing facility she was initially started on healthcare associated pneumonia treatment she improved quite well. Due to her improvement her vancomycin and Zosyn were DC'd she was kept on Levaquin she continued to quite well she continues for 2 more days.  01/12/2011 01/15/2011  . Acute renal failure: This probably secondary to decreased by mouth intake diuretics, diuretics were held initially. Once this result he was started. Also  her blood pressure medications were held initially but this is improved he restarted changes were made.  01/13/2011 01/15/2011  . UTI (lower urinary tract infection): Urine culture grow as above. She will be treated with cefuroxime for a total of 7 days.  01/12/2011 01/15/2011  . Hypoglycemia: This probably secondary to her healthcare associated pneumonia and her decreased appetite during this episode. Also her glipizide probably was contributing to this as he is not eating. Glipizide. She was monitored. Her sugars to determine the organism was restarted as does resolve.  01/12/2011 01/14/2011  . Encephalopathy acute: This probably shouldn't healthcare associated pneumonia and hypoglycemia does resolve with correction of the above.  01/12/2011 01/15/2011     Disposition and Follow-up:  Discharge Orders  Future Orders Please Complete By Expires   Diet - low sodium heart healthy      Diet - low sodium heart healthy      Increase activity slowly      Increase activity slowly        Follow-up Information    Follow up with [facility] in 1 week. (follow up on her symptoms)           DISCHARGE EXAM:  General: Alert, awake, oriented x2, in no acute distress.  HEENT: No bruits, no goiter.  Heart: Regular rate and rhythm, without murmurs, rubs, gallops.  Lungs: Good air movement, CTA B/L  Abdomen: Soft, nontender, nondistended, positive bowel sounds.  Neuro: Grossly intact, nonfocal.   Blood pressure 129/69, pulse 74, temperature 97.6 F (36.4 C), temperature source Oral, resp. rate 16, height 5\' 5"  (1.651 m), weight 64.1 kg (141 lb 5 oz), SpO2 96.00%.  No results found for this basename: NA:2,K:2,CL:2,CO2:2,GLUCOSE:2,BUN:2,CREATININE:2,CALCIUM:2,MG:2,PHOS:2 in the last 72 hours No results found for this basename: AST:2,ALT:2,ALKPHOS:2,BILITOT:2,PROT:2,ALBUMIN:2 in the last 72 hours No results found for this basename: LIPASE:2,AMYLASE:2 in the last 72 hours No results  found for this basename: WBC:2,NEUTROABS:2,HGB:2,HCT:2,MCV:2,PLT:2 in the last 72 hours  Signed: Marinda Elk M.D. 01/18/2011, 9:43 AM

## 2011-01-15 NOTE — Progress Notes (Addendum)
ANTICOAGULATION CONSULT NOTE - Follow Up  Pharmacy Consult:  Warfarin  Indication: Atrial fibrillation  Admit Complaint:  Lethargy, cough  Pharmacist System-Based Medication Review:  Anticoagulation:  Coumadin for chronic Afib, INR today is therapeutic at 2.65 and no bleeding noted.   Ref. Range 01/12/2011 15:36 01/13/2011 06:20 01/14/2011 06:25 01/15/2011 06:00  INR Latest Range: 0.00-1.49  1.84 (H) 2.16 (H) 2.81 (H) 2.65 (H)   Dose Given       7.5mg        5mg         None    Infectious Disease:  Vanc/Zosyn Rx#4 for r/o PNA and E.coli UTI + Tamiflu MD22 for r/o H.flu, afebrile today Cardiovascular:  HTN/Afib - VSS - Digoxin - MWF (adjusted for reduced renal fxn.), Toprol  Endocrinology:  DM, A1c 6.6 - CBGs are > 150.  Home meds (Glyburide 5mg  bid) Gastrointestinal / Nutrition:  Carb modified diet. -  Pantoprazole  Neurology:  Dementia - not on meds - Remeron PTA. Nephrology:  SCr 1.46, CrCL 19 ml/min, No new labs today but has been stable Pulmonary:  Hx. COPD/Bronchitis - 97% 2L [place], Mucinex, as needed nebs with Albuterol/Ipratropium Hematology / Oncology:  Hx. Cancer - No new labs. Best Practices:  Coumadin, PPI PO  Home Coumadin dose:  5mg   MoTuWeFrSa, nothing on Thurs and Sun  Home Meds not resumed ( Furosemide, Vitamin D, Remeron)    Goal of Therapy:  INR 2-3, vanc trough 15-20 mcg/mL   Plan:  Will give 5mg  Coumadin today Length of therapy for Levaquin? Monitor renal fxn, INR, C/S F/U iron supplementation, plan of care Consider restarting Mirtazapine 15mg  at bedtime.  Nadara Mustard, PharmD., MS 01/15/2011    Allergies  Allergen Reactions  . Codeine Other (See Comments)    Per [facility]    Patient Measurements: Height: 5\' 5"  (165.1 cm) Weight: 141 lb 12.1 oz (64.3 kg) (bed wt) IBW/kg (Calculated) : 57   Vital Signs: Temp: 97.5 F (36.4 C) (12/24 0414) BP: 136/51 mmHg (12/24 0414) Pulse Rate: 94  (12/24 0414)  Labs:  Basename 01/15/11 0600 01/14/11 0625  01/13/11 0620 01/12/11 1536 01/12/11 1004  HGB -- -- 8.8* 9.5* --  HCT -- -- 26.9* 28.9* 32.1*  PLT -- -- 149* 141* 168  APTT -- -- 56* 42* --  LABPROT 28.7* 30.0* 24.5* -- --  INR 2.65* 2.81* 2.16* -- --  HEPARINUNFRC -- -- -- -- --  CREATININE -- 1.46* 1.44* 1.30* --  CKTOTAL -- -- -- -- --  CKMB -- -- -- -- --  TROPONINI -- -- -- -- --   Estimated Creatinine Clearance: 19.4 ml/min (by C-G formula based on Cr of 1.46).  Medical History: Past Medical History  Diagnosis Date  . Diabetes mellitus   . Hypertension   . Atrial fibrillation   . Dementia   . Cancer 1970's; 2005    colon  . Hypercholesterolemia   . Heart murmur   . UTI (lower urinary tract infection)     "it does reoccur"  . Pneumonia   . COPD (chronic obstructive pulmonary disease) 01/12/11  . Bronchitis 2007

## 2011-01-15 NOTE — Progress Notes (Signed)
01/15/11 1742 Nursing Note: Pt had 16 beats of VTACH. Pt stable and asymptomatic on observation. MD made aware orders received to discontinue telemetry. Will continue to monitor pt. Kier Smead Scientist, clinical (histocompatibility and immunogenetics).

## 2011-01-16 LAB — PROTIME-INR
INR: 2.89 — ABNORMAL HIGH (ref 0.00–1.49)
Prothrombin Time: 30.7 seconds — ABNORMAL HIGH (ref 11.6–15.2)

## 2011-01-16 LAB — GLUCOSE, CAPILLARY: Glucose-Capillary: 182 mg/dL — ABNORMAL HIGH (ref 70–99)

## 2011-01-16 MED ORDER — WARFARIN SODIUM 2.5 MG PO TABS
2.5000 mg | ORAL_TABLET | Freq: Once | ORAL | Status: AC
  Administered 2011-01-16: 2.5 mg via ORAL
  Filled 2011-01-16: qty 1

## 2011-01-16 NOTE — Plan of Care (Signed)
Problem: Phase III Progression Outcomes Goal: Tolerating diet Outcome: Completed/Met Date Met:  01/16/11 Also takes Ensure between meals  Comments:  Taking Ensure between meals

## 2011-01-16 NOTE — Progress Notes (Signed)
ANTICOAGULATION CONSULT NOTE - Follow Up Consult  Pharmacy Consult for coumadin Indication: atrial fibrillation  Allergies  Allergen Reactions  . Codeine Other (See Comments)    Per [facility]    Patient Measurements: Height: 5\' 5"  (165.1 cm) Weight: 142 lb 3.2 oz (64.5 kg) (bed wt) IBW/kg (Calculated) : 57  Adjusted Body Weight:   Vital Signs: Temp: 98.3 F (36.8 C) (12/25 0401) BP: 134/82 mmHg (12/25 0401) Pulse Rate: 81  (12/25 0401)  Labs:  Basename 01/16/11 0525 01/15/11 0600 01/14/11 0625  HGB -- -- --  HCT -- -- --  PLT -- -- --  APTT -- -- --  LABPROT 30.7* 28.7* 30.0*  INR 2.89* 2.65* 2.81*  HEPARINUNFRC -- -- --  CREATININE -- -- 1.46*  CKTOTAL -- -- --  CKMB -- -- --  TROPONINI -- -- --   Estimated Creatinine Clearance: 19.4 ml/min (by C-G formula based on Cr of 1.46).   Medications:  Scheduled:    . alum & mag hydroxide-simeth  30 mL Oral Daily  . antiseptic oral rinse  15 mL Mouth Rinse BID  . cefUROXime  500 mg Oral BID WC  . digoxin  125 mcg Oral Q M,W,F  . ENSURE  237 mL Oral BID BM  . glyBURIDE  5 mg Oral BID WC  . guaiFENesin  600 mg Oral BID  . metoprolol succinate  25 mg Oral BID  . moxifloxacin  400 mg Oral q1800  . pantoprazole  40 mg Oral Daily  . potassium chloride  10 mEq Oral Daily  . potassium chloride SA      . sodium chloride  3 mL Intravenous Q12H  . warfarin  5 mg Oral ONCE-1800  . DISCONTD: levofloxacin  500 mg Oral Daily    Assessment:  Admit Complaint: Lethargy, cough  Pharmacist System-Based Medication Review:  Anticoagulation: Coumadin for chronic Afib, INR today is therapeutic at 2.89 and no bleeding noted.  Infectious Disease: Completed 4 days Vanc/Zosyn, D#1 ceftin/avelox for r/o PNA and E.coli UTI (R amp, FQ, TMP/SMZ), afebrile today  Cardiovascular: HTN/Afib - VSS - Digoxin - MWF (adjusted for reduced renal fxn.), Toprol  Endocrinology: DM, A1c 6.6 - CBGs 109-155. On Glyburide 5mg  bid Gastrointestinal /  Nutrition: Carb modified diet. - Pantoprazole  Neurology: Dementia - not on meds - Remeron PTA.  Nephrology: SCr 1.46, CrCL 19 ml/min, No new labs today but has been stable  Pulmonary: Hx. COPD/Bronchitis - 95% RA, Mucinex, as needed nebs with Albuterol/Ipratropium  Hematology / Oncology: Hx. Cancer - No new labs. Total iron -11, ferritin= 108, Sat=6% Best Practices: Coumadin, PPI PO   Home Coumadin dose: 5mg  MoTuWeFrSa, nothing on Thurs and Sun  Home Meds not resumed ( Furosemide, Vitamin D, Remeron)   Goal of Therapy:  INR 2-3, vanc trough 15-20 mcg/mL   Plan:  1. Will give 2.5mg  Coumadin today  2. Monitor renal fxn, INR, C/S  3. Consider Iron supplement for low total iron and anemia 4. Consider restarting Mirtazapine 15mg  at bedtime.   Dannielle Huh 01/16/2011,9:44 AM

## 2011-01-16 NOTE — Progress Notes (Signed)
Subjective: Patient actually more awake today and good mood. She knows Christmas. Has no difficulty breathing.  Objective: Filed Vitals:   01/15/11 0952 01/15/11 1324 01/15/11 2158 01/16/11 0401  BP: 144/73 125/65 141/75 134/82  Pulse: 63 83 83 81  Temp:  97.5 F (36.4 C) 98.4 F (36.9 C) 98.3 F (36.8 C)  TempSrc:  Oral    Resp:  20 20 20   Height:      Weight:    64.5 kg (142 lb 3.2 oz)  SpO2:  96% 95% 95%   Weight change: 0.2 kg (7.1 oz)  Intake/Output Summary (Last 24 hours) at 01/16/11 0936 Last data filed at 01/16/11 0840  Gross per 24 hour  Intake    513 ml  Output   1626 ml  Net  -1113 ml    General: Alert, awake, oriented x1, in no acute distress.  HEENT: No bruits, no goiter.  Heart: Regular rate and rhythm, without murmurs, rubs, gallops.  Lungs: Good air movement, CTA B/L Abdomen: Soft, nontender, nondistended, positive bowel sounds.  Neuro: Grossly intact, nonfocal.   Lab Results:  [facility] 01/14/11 0625  NA 142  K 4.0  CL 107  CO2 27  GLUCOSE 114*  BUN 27*  CREATININE 1.46*  CALCIUM 8.3*  MG --  PHOS --   No results found for this basename: WBC:2,NEUTROABS:2,HGB:2,HCT:2,MCV:2,PLT:2 in the last 72 hours No results found for this basename: HGBA1C:2 in the last 72 hours Micro Results: Recent Results (from the past 240 hour(s))  URINE CULTURE     Status: Normal   Collection Time   01/12/11  4:37 PM      Component Value Range Status Comment   Specimen Description URINE, CLEAN CATCH   Final    Special Requests NONE   Final    Setup Time [id number]   Final    Colony Count >=100,000 COLONIES/ML   Final    Culture ESCHERICHIA COLI   Final    Report Status 01/15/2011 FINAL   Final    Organism ID, Bacteria ESCHERICHIA COLI   Final     Studies/Results: No results found.  Medications: I have reviewed the patient's current medications.   Active Problems:  Diabetes mellitus  A-fib  HTN (hypertension)  Dementia  Anemia    Assessment and  plan: -vancomycin, Zosyn day 2,influenza  PCR -. But has ongoing cough. Her blood pressure has remained stable. On oral antibiotics awaiting placement to skilled nursing facility.     LOS: 4 days   Marinda Elk M.D. Pager: [phone] [facility] 01/16/2011, 9:36 AM

## 2011-01-17 LAB — GLUCOSE, CAPILLARY
Glucose-Capillary: 116 mg/dL — ABNORMAL HIGH (ref 70–99)
Glucose-Capillary: 146 mg/dL — ABNORMAL HIGH (ref 70–99)

## 2011-01-17 LAB — PROTIME-INR: INR: 3.01 — ABNORMAL HIGH (ref 0.00–1.49)

## 2011-01-17 MED ORDER — WARFARIN 0.5 MG HALF TABLET
0.5000 mg | ORAL_TABLET | Freq: Once | ORAL | Status: DC
  Filled 2011-01-17 (×2): qty 1

## 2011-01-17 MED ORDER — MOXIFLOXACIN HCL 400 MG PO TABS: 400.0000 mg | ORAL_TABLET | Freq: Every day | ORAL | Status: DC

## 2011-01-17 NOTE — Progress Notes (Signed)
Pt ready to transfer to 5005 Called report to [facility] on 5000. Pt transported to 5005 per recliner chair with all belongings.    Marisa Cyphers RN

## 2011-01-17 NOTE — Progress Notes (Signed)
ANTICOAGULATION CONSULT NOTE - Follow Up Consult  Pharmacy Consult for Coumadin Indication: atrial fibrillation  Allergies  Allergen Reactions  . Codeine Other (See Comments)    Per [facility]    Patient Measurements: Height: 5\' 5"  (165.1 cm) Weight: 141 lb 5 oz (64.1 kg) (bed wt) IBW/kg (Calculated) : 57  Adjusted Body Weight:   Vital Signs: Temp: 97.4 F (36.3 C) (12/26 0417) BP: 127/56 mmHg (12/26 0417) Pulse Rate: 82  (12/26 0417)  Labs:  Basename 01/17/11 0500 01/16/11 0525 01/15/11 0600  HGB -- -- --  HCT -- -- --  PLT -- -- --  APTT -- -- --  LABPROT 31.7* 30.7* 28.7*  INR 3.01* 2.89* 2.65*  HEPARINUNFRC -- -- --  CREATININE -- -- --  CKTOTAL -- -- --  CKMB -- -- --  TROPONINI -- -- --   Estimated Creatinine Clearance: 19.4 ml/min (by C-G formula based on Cr of 1.46).    Assessment: Pharmacist System-Based Medication Review: Anticoagulation: 89yof on Coumadin for chronic afib. INR (3.01) is supratherapeutic and continues to trend up. No CBC since 12/22. No bleeding noted. PTA regimen: 5mg  daily except nothing Thu/Sun  Infectious Disease: Completed 4 days Vanc/Zosyn, D#2 avelox/ceftin (dose ok with CrCl) for r/o PNA and E.coli UTI (R amp, FQ, TMP/SMZ); afebrile today   Cardiovascular: hx HTN/Afib ; NSR, vitals stable; digoxin, Toprol Endocrinology: DM, A1c 6.6; CBGs 116-182 - on glyburide Gastrointestinal / Nutrition: Carb modified diet. PPI Nephrology: No BMET since 12/23 (SCr 1.46, CrCL 19 ml/min) Pulmonary: RA - 97%, Mucinex, Nebs Hematology / Oncology: low Fe/TIBC Best Practices: Coumadin, PPI PO  Home Meds not resumed ( Furosemide, Vitamin D, Remeron)  Goal of Therapy:  INR 2-3   Plan:  1. Coumadin 0.5mg  po x 1 today 2. Follow-up AM INR and discharge plans  Cleon Dew [phone] 01/17/2011,8:37 AM

## 2011-01-17 NOTE — Progress Notes (Signed)
Subjective: Has no difficulty breathing.  Objective: Filed Vitals:   01/16/11 2105 01/17/11 0417 01/17/11 1033 01/17/11 1034  BP: 144/69 127/56  122/60  Pulse: 83 82 77 77  Temp: 97.4 F (36.3 C) 97.4 F (36.3 C)    TempSrc:      Resp: 20 20    Height:      Weight:  64.1 kg (141 lb 5 oz)    SpO2: 99% 97%     Weight change: -0.4 kg (-14.1 oz)  Intake/Output Summary (Last 24 hours) at 01/17/11 1110 Last data filed at 01/17/11 0852  Gross per 24 hour  Intake    550 ml  Output    851 ml  Net   -301 ml    General: Alert, awake, oriented x1, in no acute distress.  HEENT: No bruits, no goiter.  Heart: Regular rate and rhythm, without murmurs, rubs, gallops.  Lungs: Good air movement, CTA B/L Abdomen: Soft, nontender, nondistended, positive bowel sounds.  Neuro: Grossly intact, nonfocal.   Lab Results: No results found for this basename: NA:2,K:2,CL:2,CO2:2,GLUCOSE:2,BUN:2,CREATININE:2,CALCIUM:2,MG:2,PHOS:2 in the last 72 hours No results found for this basename: WBC:2,NEUTROABS:2,HGB:2,HCT:2,MCV:2,PLT:2 in the last 72 hours No results found for this basename: HGBA1C:2 in the last 72 hours Micro Results: Recent Results (from the past 240 hour(s))  URINE CULTURE     Status: Normal   Collection Time   01/12/11  4:37 PM      Component Value Range Status Comment   Specimen Description URINE, CLEAN CATCH   Final    Special Requests NONE   Final    Setup Time [id number]   Final    Colony Count >=100,000 COLONIES/ML   Final    Culture ESCHERICHIA COLI   Final    Report Status 01/15/2011 FINAL   Final    Organism ID, Bacteria ESCHERICHIA COLI   Final     Studies/Results: No results found.  Medications: I have reviewed the patient's current medications.   Active Problems:  Diabetes mellitus  A-fib  HTN (hypertension)  Dementia  Anemia    Assessment and plan: -Her blood pressure has remained stable. On oral antibiotics awaiting placement to skilled nursing  facility.     LOS: 5 days   Marinda Elk M.D. Pager: [phone] [facility] 01/17/2011, 11:10 AM

## 2011-01-17 NOTE — Progress Notes (Signed)
Physical Therapy Treatment Patient Details Name: [name] MRN: [id number] DOB: 11-20-1910 Today's Date: 01/17/2011  PT Assessment/Plan  PT - Assessment/Plan Comments on Treatment Session: Pt needed more assistance and unable to ambulate this session.  PT needed +2 assist with transfer with max cues for technique.   PT Plan: Discharge plan remains appropriate;Frequency remains appropriate PT Frequency: Min 2X/week Follow Up Recommendations: Skilled nursing facility Equipment Recommended: Defer to next venue PT Goals  Acute Rehab PT Goals PT Goal Formulation: Patient unable to participate in goal setting Time For Goal Achievement: 2 weeks Pt will Roll Supine to Right Side: with supervision PT Goal: Rolling Supine to Right Side - Progress: Progressing toward goal Pt will Roll Supine to Left Side: with supervision PT Goal: Rolling Supine to Left Side - Progress: Progressing toward goal Pt will go Supine/Side to Sit: with supervision;with HOB 0 degrees PT Goal: Supine/Side to Sit - Progress: Progressing toward goal Pt will go Sit to Supine/Side: with supervision;with HOB 0 degrees Pt will go Sit to Stand: with min assist PT Goal: Sit to Stand - Progress: Progressing toward goal Pt will go Stand to Sit: with min assist PT Goal: Stand to Sit - Progress: Progressing toward goal Pt will Transfer Bed to Chair/Chair to Bed: with min assist PT Transfer Goal: Bed to Chair/Chair to Bed - Progress: Progressing toward goal  PT Treatment Precautions/Restrictions  Precautions Precautions: Fall Required Braces or Orthoses: No Restrictions Weight Bearing Restrictions: No Mobility (including Balance) Bed Mobility Bed Mobility: Yes Supine to Sit: 3: Mod assist;With rails;HOB elevated (Comment degrees) Supine to Sit Details (indicate cue type and reason): (A) to elevate trunk OOB and (A) with LE OOB.  Max cues for technique. Sitting - Scoot to Edge of Bed: 2: Max assist Sitting - Scoot to  [place] of Bed Details (indicate cue type and reason): (A) using pad to scoot hips to EOB with max cues for hand placement. Transfers Transfers: Yes Sit to Stand: 1: +2 Total assist;From bed;From elevated surface;Patient percentage (comment) (Pt 50%) Sit to Stand Details (indicate cue type and reason): (A) to initiate transfer with cues for hand placement.  VCs and tactiles for proper weight shift and lean forward to complete transfer. Stand to Sit: 1: +2 Total assist;Patient percentage (comment);To chair/3-in-1 (pt 50%) Stand to Sit Details: (A) to slowly descend to recliner with max cues for hand and LE placement.   Stand Pivot Transfers: 1: +2 Total assist;Patient percentage (comment) (pt 50%) Stand Pivot Transfer Details (indicate cue type and reason): (A) with LE and hips to advance hips to recliner.  Pt attempted to sit during transfer and needs cues to maintain upright posture and manual cues to advance LE. Ambulation/Gait Ambulation/Gait: No  Posture/Postural Control Posture/Postural Control: Postural limitations Postural Limitations: Thoracic kyphosis; flexed trunk in sitting and standing Balance Balance Assessed: Yes Static Sitting Balance Static Sitting - Balance Support: Bilateral upper extremity supported;Feet supported Static Sitting - Level of Assistance: 4: Min assist Static Sitting - Comment/# of Minutes: occasionl (A) to maintain upright posture when pt attempts to return to supine due to fatigue.  Pt sat for 5 minutes to prepare for transfer. Exercise    End of Session PT - End of Session Equipment Utilized During Treatment: Gait belt Activity Tolerance: Patient limited by fatigue;Other (comment) Patient left: in bed;with call bell in reach Nurse Communication: Mobility status for transfers General Behavior During Session: [facility] for tasks performed Cognition: Impaired, at baseline  Monai Hindes 01/17/2011, 12:47 PM [phone]

## 2011-01-18 LAB — GLUCOSE, CAPILLARY: Glucose-Capillary: 130 mg/dL — ABNORMAL HIGH (ref 70–99)

## 2011-01-18 LAB — PROTIME-INR: INR: 2.66 — ABNORMAL HIGH (ref 0.00–1.49)

## 2011-01-18 MED ORDER — MOXIFLOXACIN HCL 400 MG PO TABS: 400.0000 mg | ORAL_TABLET | ORAL | Status: DC

## 2011-01-18 MED ORDER — MOXIFLOXACIN HCL 400 MG PO TABS
400.0000 mg | ORAL_TABLET | ORAL | Status: DC
  Administered 2011-01-18: 400 mg via ORAL
  Filled 2011-01-18 (×2): qty 1

## 2011-01-18 MED ORDER — FERROUS SULFATE 325 (65 FE) MG PO TABS
325.0000 mg | ORAL_TABLET | Freq: Three times a day (TID) | ORAL | Status: DC
  Administered 2011-01-18: 325 mg via ORAL
  Filled 2011-01-18 (×3): qty 1

## 2011-01-18 MED ORDER — WARFARIN SODIUM 5 MG PO TABS
5.0000 mg | ORAL_TABLET | Freq: Once | ORAL | Status: DC
  Filled 2011-01-18: qty 1

## 2011-01-18 MED ORDER — FERROUS SULFATE 325 (65 FE) MG PO TABS: 325.0000 mg | ORAL_TABLET | Freq: Three times a day (TID) | ORAL | Status: AC

## 2011-01-18 NOTE — Progress Notes (Signed)
Subjective: She has no complaints  Objective: Filed Vitals:   01/17/11 1340 01/17/11 2230 01/17/11 2306 01/18/11 0635  BP: 129/60 113/74 113/76 129/69  Pulse: 72 90  74  Temp: 97.6 F (36.4 C) 97.9 F (36.6 C)  97.6 F (36.4 C)  TempSrc:      Resp: 20 16  16   Height:      Weight:      SpO2:  98%  96%   Weight change:   Intake/Output Summary (Last 24 hours) at 01/18/11 0934 Last data filed at 01/18/11 0700  Gross per 24 hour  Intake   1203 ml  Output    220 ml  Net    983 ml    General: Alert, awake, oriented x1, in no acute distress.  HEENT: No bruits, no goiter.  Heart: Regular rate and rhythm, without murmurs, rubs, gallops.  Lungs: Good air movement, CTA B/L Abdomen: Soft, nontender, nondistended, positive bowel sounds.  Neuro: Grossly intact, nonfocal.   Lab Results:  Micro Results: Recent Results (from the past 240 hour(s))  URINE CULTURE     Status: Normal   Collection Time   01/12/11  4:37 PM      Component Value Range Status Comment   Specimen Description URINE, CLEAN CATCH   Final    Special Requests NONE   Final    Setup Time [id number]   Final    Colony Count >=100,000 COLONIES/ML   Final    Culture ESCHERICHIA COLI   Final    Report Status 01/15/2011 FINAL   Final    Organism ID, Bacteria ESCHERICHIA COLI   Final     Studies/Results: No results found.  Medications: I have reviewed the patient's current medications.   Active Problems:  Diabetes mellitus  A-fib  HTN (hypertension)  Dementia  Anemia    Assessment and plan: -Her blood pressure has remained stable. On oral antibiotics awaiting placement to skilled nursing facility.   that she was started on ferrous  sulfate3 times a day. She will followup with her primary care doctor for her anemia as an outpatient. At her age and being on Coumadin for each or fibrillation the most likely cause of her bleeding is her GI tract..     LOS: 6 days   Marinda Elk M.D. Pager:  [phone] [facility] 01/18/2011, 9:34 AM

## 2011-01-18 NOTE — Progress Notes (Signed)
01/18/2011 Galad[facility] Shroff [facility] Case Management Note [phone]  Utilization review completed.  

## 2011-01-18 NOTE — Progress Notes (Signed)
Clinical Child psychotherapist provided pt daughter bed offers and pt daughter chooses bed at [facility]. Clinical Social Worker contacted [facility] who stated that facility has a bed available today. Clinical Social Worker facilitated pt discharge needs including obtaining insurance authorization, contacting facility, family, and arranging ambulance transportation to [facility]. No further social work needs at this time.  Jacklynn Lewis, MSW, LCSWA  Clinical Social Work [phone]

## 2011-01-18 NOTE — Progress Notes (Signed)
ANTICOAGULATION CONSULT NOTE - Follow Up Consult  Pharmacy Consult for Coumadin Indication: atrial fibrillation  Allergies  Allergen Reactions  . Codeine Other (See Comments)    Per [facility]    Patient Measurements: Height: 5\' 5"  (165.1 cm) Weight: 141 lb 5 oz (64.1 kg) (bed wt) IBW/kg (Calculated) : 57   Vital Signs: Temp: 97.6 F (36.4 C) (12/27 0635) BP: 129/69 mmHg (12/27 0635) Pulse Rate: 74  (12/27 0635)  Labs:  Basename 01/18/11 0628 01/17/11 0500 01/16/11 0525  HGB -- -- --  HCT -- -- --  PLT -- -- --  APTT -- -- --  LABPROT 28.8* 31.7* 30.7*  INR 2.66* 3.01* 2.89*  HEPARINUNFRC -- -- --  CREATININE -- -- --  CKTOTAL -- -- --  CKMB -- -- --  TROPONINI -- -- --   Estimated Creatinine Clearance: 19.4 ml/min (by C-G formula based on Cr of 1.46).   Pharmacist System-Based Medication Review: Anticoag: INR therapeutic.  Dose not given yesterday therefor will give 5mg  dose tonight.  No CBC since 12/22. No bleeding noted.  ID: Completed 4 days Vanc/Zosyn, D#3 avelox/ceftin (dose ok with CrCl) for r/o PNA and E.coli UTI (R amp, FQ, TMP/SMZ); afebrile today  Cards: hx HTN/Afib ; NSR, VSS; digoxin, Toprol Endo: DM, A1c 6.6; CBGs <150 - on glyburide GI / Nutrition: Carb modified diet. PPI Nephro: No BMET since 12/23 (SCr 1.46, CrCL 19 ml/min) Pulm: RA - 96%, Mucinex, Nebs prn Hematology / Oncology: low Fe/TIBC Best Practices: Coumadin, PPI PO  Home Meds not resumed (Furosemide, Vitamin D, Remeron)  Goal of Therapy:  INR 2-3  Assessment/Plan: 77 yo F on Coumadin PTA for chronic afib.  PTA regimen: 5mg  daily except nothing Thu/Sun.  INR therapeutic.  Noted patient/family refused Coumadin dose last PM.  Will attempt to restart PTA regimen with 5mg  dose tonight.  Follow-up AM INR and discharge plans.  [employer], Pharm.D., BCPS Clinical Pharmacist Pager [phone]  01/18/2011,9:01 AM

## 2011-03-22 ENCOUNTER — Emergency Department (HOSPITAL_COMMUNITY): Payer: Medicare Other

## 2011-03-22 ENCOUNTER — Encounter (HOSPITAL_COMMUNITY): Payer: Self-pay | Admitting: *Deleted

## 2011-03-22 ENCOUNTER — Emergency Department (HOSPITAL_COMMUNITY)
Admission: EM | Admit: 2011-03-22 | Discharge: 2011-03-22 | Disposition: A | Discharge status: Home / Self Care | Payer: Medicare Other | Attending: Emergency Medicine | Admitting: Emergency Medicine

## 2011-03-22 ENCOUNTER — Other Ambulatory Visit: Payer: Self-pay

## 2011-03-22 DIAGNOSIS — Z7901 Long term (current) use of anticoagulants: Secondary | ICD-10-CM | POA: Insufficient documentation

## 2011-03-22 DIAGNOSIS — W19XXXA Unspecified fall, initial encounter: Secondary | ICD-10-CM

## 2011-03-22 DIAGNOSIS — I10 Essential (primary) hypertension: Secondary | ICD-10-CM | POA: Insufficient documentation

## 2011-03-22 DIAGNOSIS — F0390 Unspecified dementia without behavioral disturbance: Secondary | ICD-10-CM | POA: Insufficient documentation

## 2011-03-22 DIAGNOSIS — F29 Unspecified psychosis not due to a substance or known physiological condition: Secondary | ICD-10-CM | POA: Insufficient documentation

## 2011-03-22 DIAGNOSIS — R791 Abnormal coagulation profile: Secondary | ICD-10-CM

## 2011-03-22 DIAGNOSIS — I4891 Unspecified atrial fibrillation: Secondary | ICD-10-CM | POA: Insufficient documentation

## 2011-03-22 DIAGNOSIS — N390 Urinary tract infection, site not specified: Secondary | ICD-10-CM

## 2011-03-22 DIAGNOSIS — Z85038 Personal history of other malignant neoplasm of large intestine: Secondary | ICD-10-CM | POA: Insufficient documentation

## 2011-03-22 DIAGNOSIS — E119 Type 2 diabetes mellitus without complications: Secondary | ICD-10-CM | POA: Insufficient documentation

## 2011-03-22 LAB — URINALYSIS, ROUTINE W REFLEX MICROSCOPIC
Nitrite: POSITIVE — AB
Specific Gravity, Urine: 1.015 (ref 1.005–1.030)
pH: 6.5 (ref 5.0–8.0)

## 2011-03-22 LAB — BASIC METABOLIC PANEL
Chloride: 103 mEq/L (ref 96–112)
GFR calc Af Amer: 36 mL/min — ABNORMAL LOW (ref >90)
Potassium: 3.5 mEq/L (ref 3.5–5.1)
Sodium: 140 mEq/L (ref 135–145)

## 2011-03-22 LAB — PROTIME-INR: INR: 6.31 (ref 0.00–1.49)

## 2011-03-22 LAB — URINE MICROSCOPIC-ADD ON

## 2011-03-22 MED ORDER — CEPHALEXIN 500 MG PO CAPS: 500.0000 mg | ORAL_CAPSULE | Freq: Two times a day (BID) | ORAL | Status: AC

## 2011-03-22 MED ORDER — CEPHALEXIN 500 MG PO CAPS
500.0000 mg | ORAL_CAPSULE | Freq: Once | ORAL | Status: AC
  Administered 2011-03-22: 500 mg via ORAL
  Filled 2011-03-22: qty 1

## 2011-03-22 NOTE — ED Notes (Signed)
[facility] is [facility], alert and oriented x4 to her norm per staff, at baseline. Pt is happy, but not completely with it. Staff found her laying beside her bed, pt does not have a roommate. Pt was sleeping on the floor. Staff called ems per fall policy. At facility pt denied pain, at [facility] pt reports right side of body hurts. Pupils appeared constricted, equal and reactive, but no pain meds listed on MAR. Staff reported coumadin levels were high and pt had been started on potassium in last 24 hours.

## 2011-03-22 NOTE — ED Notes (Signed)
Pt is now stating that "she fell off a truck" pt is confused about story and not making sense with answers.

## 2011-03-22 NOTE — ED Notes (Signed)
AVW:[id number]<XB> Expected date:<BR> Expected time:<BR> Means of arrival:<BR> Comments:<BR> Ems/ 83 pain

## 2011-03-22 NOTE — ED Provider Notes (Addendum)
History     CSN: [id number]  Arrival date & time 03/22/11  0906   First MD Initiated Contact with Patient 03/22/11 [phone]      Chief Complaint  Patient presents with  . Fall    HPI Patient presents following a possible fall.  She is a nursing home resident.  This a.m., just prior to presentation the patient's staff found her on the floor next to her bed.  Per report there is no blood on the scene.  The patient cannot describe the event.  Per report the patient has been in her usual state of health, has recently stopped Coumadin, has recently started potassium supplement.  On my evaluation the patient denies any pain, states that she wants to go home.  I spoke with the nursing home staff about the event immediately after my evaluation of the patient. Past Medical History  Diagnosis Date  . Diabetes mellitus   . Hypertension   . Atrial fibrillation   . Dementia   . Cancer 1970's; 2005    colon  . Hypercholesterolemia   . Heart murmur   . UTI (lower urinary tract infection)     "it does reoccur"  . Pneumonia   . COPD (chronic obstructive pulmonary disease) 01/12/11  . Bronchitis 2007    Past Surgical History  Procedure Date  . Colon surgery   . Colon resection 1970's; 2005  . Cataract extraction     "one eye; don't know which; don't know if they put lens in"    No family history on file.  History  Substance Use Topics  . Smoking status: Never Smoker   . Smokeless tobacco: Never Used  . Alcohol Use: Yes     "used to have spritzers when she was young"    OB History    Grav Para Term Preterm Abortions TAB SAB Ect Mult Living                  Review of Systems  Unable to perform ROS: Dementia    Allergies  Codeine  Home Medications   Current Outpatient Rx  Name Route Sig Dispense Refill  . ACETAMINOPHEN 325 MG PO TABS Oral Take 650 mg by mouth 3 (three) times daily as needed. For pain    . VITAMIN D 1000 UNITS PO TABS Oral Take 2,000 Units by mouth daily.       Marland Kitchen DIGOXIN 0.125 MG PO TABS Oral Take 125 mcg by mouth every Monday, Wednesday, and Friday.      . ENSURE PO LIQD Oral Take 237 mLs by mouth 2 (two) times daily between meals.      Di Kindle SULFATE 325 (65 FE) MG PO TABS Oral Take 1 tablet (325 mg total) by mouth 3 (three) times daily with meals.    . FUROSEMIDE 40 MG PO TABS Oral Take 120 mg by mouth every morning.      . FUROSEMIDE 80 MG PO TABS Oral Take 80 mg by mouth every evening.      Marland Kitchen GLYBURIDE 5 MG PO TABS Oral Take 5 mg by mouth every evening.     Marland Kitchen METOPROLOL SUCCINATE ER 25 MG PO TB24 Oral Take 25 mg by mouth 2 (two) times daily.      Marland Kitchen MIRTAZAPINE 15 MG PO TABS Oral Take 15 mg by mouth at bedtime.      Marland Kitchen PANTOPRAZOLE SODIUM 40 MG PO TBEC Oral Take 40 mg by mouth daily.      Marland Kitchen  POLYETHYLENE GLYCOL 3350 PO PACK Oral Take 17 g by mouth daily.      Marland Kitchen POTASSIUM CHLORIDE 10 MEQ PO TBCR Oral Take 10 mEq by mouth 2 (two) times daily.     . WARFARIN SODIUM 5 MG PO TABS Oral Take 5 mg by mouth daily. Patient takes every day except for Wednesday. And Wednesday she takes 1 and a 1/2 tablets.      BP 147/79  Pulse 90  Temp(Src) 97.8 F (36.6 C) (Oral)  Resp 18  SpO2 99%  Physical Exam  Nursing note and vitals reviewed. Constitutional: She appears well-nourished. No distress.  HENT:  Head: Normocephalic and atraumatic. Head is without raccoon's eyes, without Battle's sign, without abrasion, without contusion, without laceration, without right periorbital erythema and without left periorbital erythema. Hair is normal.  Neck: Neck supple. No spinous process tenderness and no muscular tenderness present. No rigidity. No erythema present.  Cardiovascular: Normal rate and regular rhythm.   Pulmonary/Chest: Effort normal. No respiratory distress.  Abdominal: Soft. She exhibits no distension.  Musculoskeletal:       Hips stable, no appreciable bony deformities throughout the lower extremities, nor about the head and neck.  Patient is moving  all extremities spontaneously  Neurological: She is alert. No cranial nerve deficit. She exhibits normal muscle tone. Coordination normal.       Patient inconsistently follows neurologic exam, but there are no gross focal deficits  Skin: Skin is warm and dry.  Psychiatric: She has a normal mood and affect.       Dementia    ED Course  Procedures (including critical care time)   Labs Reviewed  PROTIME-INR  URINALYSIS, ROUTINE W REFLEX MICROSCOPIC  BASIC METABOLIC PANEL   No results found.   No diagnosis found.  CT reviewed by me   Date: 03/22/2011  Rate: 91  Rhythm: junctional  QRS Axis: left  Intervals: QT prolonged  ST/T Wave abnormalities: nonspecific ST/T changes  Conduction Disutrbances:nonspecific intraventricular conduction delay  Narrative Interpretation:   Old EKG Reviewed: changes noted ABNORMAL ECG   MDM  This elderly female presents from her nursing facility after a possible fall.  On exam the patient is in no distress with unremarkable vital signs and denies any pain.  The patient has dementia, but her denial of any complaints his reassuring.  The patient's labs are notable for supratherapeutic INR.  Review of the patient's chart from the nursing home notes that today's value is lower than 2 days ago, and the patient has recently stopped Coumadin.  The patient's CT does not demonstrate acute hemorrhage, nor fracture.  The patient's labs are otherwise notable for urinary tract infection.  Given these findings, the patient's DO NOT RESUSCITATE status, her absence of complaints, her unremarkable vital signs, she will be discharged to return to her nursing facility.        Gerhard Munch, MD 03/22/11 1135  Gerhard Munch, MD 03/22/11 1139

## 2011-03-22 NOTE — ED Notes (Signed)
Pt to ct 

## 2011-03-22 NOTE — ED Notes (Signed)
Pt alert to her baseline. Respirations even and unlabored, bilateral symmetrical rise and fall of chest. Skin warm and dry. In no acute distress. Denies needs. Back from ct

## 2011-03-22 NOTE — Discharge Instructions (Signed)

## 2011-03-22 NOTE — ED Notes (Signed)
Dr alerted of pt critical values

## 2011-04-03 ENCOUNTER — Emergency Department (HOSPITAL_COMMUNITY)
Admission: EM | Admit: 2011-04-03 | Discharge: 2011-04-03 | Disposition: A | Discharge status: Home / Self Care | Payer: Medicare Other | Attending: Emergency Medicine | Admitting: Emergency Medicine

## 2011-04-03 ENCOUNTER — Emergency Department (HOSPITAL_COMMUNITY): Payer: Medicare Other

## 2011-04-03 ENCOUNTER — Encounter (HOSPITAL_COMMUNITY): Payer: Self-pay

## 2011-04-03 DIAGNOSIS — Y9210 Unspecified residential institution as the place of occurrence of the external cause: Secondary | ICD-10-CM | POA: Insufficient documentation

## 2011-04-03 DIAGNOSIS — I10 Essential (primary) hypertension: Secondary | ICD-10-CM | POA: Insufficient documentation

## 2011-04-03 DIAGNOSIS — F0390 Unspecified dementia without behavioral disturbance: Secondary | ICD-10-CM | POA: Insufficient documentation

## 2011-04-03 DIAGNOSIS — N390 Urinary tract infection, site not specified: Secondary | ICD-10-CM

## 2011-04-03 DIAGNOSIS — W19XXXA Unspecified fall, initial encounter: Secondary | ICD-10-CM | POA: Insufficient documentation

## 2011-04-03 DIAGNOSIS — E1169 Type 2 diabetes mellitus with other specified complication: Secondary | ICD-10-CM | POA: Insufficient documentation

## 2011-04-03 DIAGNOSIS — Z7901 Long term (current) use of anticoagulants: Secondary | ICD-10-CM | POA: Insufficient documentation

## 2011-04-03 DIAGNOSIS — J323 Chronic sphenoidal sinusitis: Secondary | ICD-10-CM | POA: Insufficient documentation

## 2011-04-03 DIAGNOSIS — I4891 Unspecified atrial fibrillation: Secondary | ICD-10-CM | POA: Insufficient documentation

## 2011-04-03 DIAGNOSIS — J322 Chronic ethmoidal sinusitis: Secondary | ICD-10-CM | POA: Insufficient documentation

## 2011-04-03 DIAGNOSIS — R51 Headache: Secondary | ICD-10-CM | POA: Insufficient documentation

## 2011-04-03 DIAGNOSIS — T1490XA Injury, unspecified, initial encounter: Secondary | ICD-10-CM | POA: Insufficient documentation

## 2011-04-03 DIAGNOSIS — E162 Hypoglycemia, unspecified: Secondary | ICD-10-CM

## 2011-04-03 LAB — URINALYSIS, ROUTINE W REFLEX MICROSCOPIC
Bilirubin Urine: NEGATIVE
Glucose, UA: NEGATIVE mg/dL
Ketones, ur: NEGATIVE mg/dL
Protein, ur: 100 mg/dL — AB

## 2011-04-03 LAB — BASIC METABOLIC PANEL
Calcium: 8.6 mg/dL (ref 8.4–10.5)
GFR calc Af Amer: 36 mL/min — ABNORMAL LOW (ref >90)
GFR calc non Af Amer: 31 mL/min — ABNORMAL LOW (ref >90)
Potassium: 3.8 mEq/L (ref 3.5–5.1)
Sodium: 138 mEq/L (ref 135–145)

## 2011-04-03 LAB — URINE MICROSCOPIC-ADD ON

## 2011-04-03 LAB — PROTIME-INR: INR: 2.7 — ABNORMAL HIGH (ref 0.00–1.49)

## 2011-04-03 MED ORDER — DEXTROSE 50 % IV SOLN
25.0000 g | Freq: Once | INTRAVENOUS | Status: AC
  Administered 2011-04-03: 25 g via INTRAVENOUS
  Filled 2011-04-03: qty 50

## 2011-04-03 MED ORDER — CEPHALEXIN 500 MG PO CAPS: 500.0000 mg | ORAL_CAPSULE | Freq: Four times a day (QID) | ORAL | Status: AC

## 2011-04-03 MED ORDER — DEXTROSE 5 % IV SOLN
1.0000 g | Freq: Once | INTRAVENOUS | Status: AC
  Administered 2011-04-03: 1 g via INTRAVENOUS
  Filled 2011-04-03: qty 10

## 2011-04-03 NOTE — ED Notes (Signed)
HYQ:[id number]<IO> Expected date:<BR> Expected time:11:49 AM<BR> Means of arrival:<BR> Comments:<BR> M41 - 89yoF fall, no pain or injury per ems (facility protocol)

## 2011-04-03 NOTE — ED Notes (Signed)
Per EMS- Patient is a resident at [facility] and staff found patient on the floor.  Patient was awake and alert. No apparent injury.

## 2011-04-03 NOTE — ED Notes (Signed)
Report given to the RN on call for [facility].

## 2011-04-03 NOTE — ED Provider Notes (Signed)
History     CSN: [id number]  Arrival date & time 04/03/11  1152   First MD Initiated Contact with Patient 04/03/11 1322      Chief Complaint  Patient presents with  . Fall    (Consider location/radiation/quality/duration/timing/severity/associated sxs/prior treatment) Patient is a 78 y.o. female presenting with fall. The history is provided by the EMS personnel and the nursing home. No language interpreter was used.  Fall The accident occurred 1 to 2 hours ago. Incident: found on floor by nursing home. She fell from an unknown height. She landed on carpet. There was no blood loss. Point of impact: unknown. Pain location: no complaint of pain. The patient is experiencing no pain. She was not ambulatory at the scene. There was no entrapment after the fall.    Past Medical History  Diagnosis Date  . Diabetes mellitus   . Hypertension   . Atrial fibrillation   . Dementia   . Cancer 1970's; 2005    colon  . Hypercholesterolemia   . Heart murmur   . UTI (lower urinary tract infection)     "it does reoccur"  . Pneumonia   . COPD (chronic obstructive pulmonary disease) 01/12/11  . Bronchitis 2007    Past Surgical History  Procedure Date  . Colon surgery   . Colon resection 1970's; 2005  . Cataract extraction     "one eye; don't know which; don't know if they put lens in"    No family history on file.  History  Substance Use Topics  . Smoking status: Never Smoker   . Smokeless tobacco: Never Used  . Alcohol Use: Yes     "used to have spritzers when she was young"    OB History    Grav Para Term Preterm Abortions TAB SAB Ect Mult Living                  Review of Systems  Unable to perform ROS: Dementia    Allergies  Codeine  Home Medications   Current Outpatient Rx  Name Route Sig Dispense Refill  . ACETAMINOPHEN 325 MG PO TABS Oral Take 650 mg by mouth 3 (three) times daily as needed. For pain. Do not exceed 4gms in 24 hrs.    Marland Kitchen VITAMIN D 1000 UNITS PO  TABS Oral Take 2,000 Units by mouth daily.      Marland Kitchen DIGOXIN 0.125 MG PO TABS Oral Take 125 mcg by mouth every Monday, Wednesday, and Friday.     . ENSURE PO LIQD Oral Take 237 mLs by mouth 2 (two) times daily between meals.      Di Kindle SULFATE 325 (65 FE) MG PO TABS Oral Take 1 tablet (325 mg total) by mouth 3 (three) times daily with meals.    . FUROSEMIDE 80 MG PO TABS Oral Take 80 mg by mouth every evening.      Marland Kitchen GLYBURIDE 2.5 MG PO TABS Oral Take 2.5 mg by mouth See admin instructions. Take 1 tablet at 8am and 1 tablet at 5 pm    . METOPROLOL SUCCINATE ER 25 MG PO TB24 Oral Take 25 mg by mouth 2 (two) times daily.      Marland Kitchen MIRTAZAPINE 15 MG PO TABS Oral Take 15 mg by mouth at bedtime. For depression    . PANTOPRAZOLE SODIUM 40 MG PO TBEC Oral Take 40 mg by mouth daily.      Marland Kitchen POLYETHYLENE GLYCOL 3350 PO PACK Oral Take 17 g by mouth daily.      Marland Kitchen  POTASSIUM CHLORIDE 10 MEQ PO TBCR Oral Take 10 mEq by mouth 2 (two) times daily.     . WARFARIN SODIUM 5 MG PO TABS Oral Take 5 mg by mouth See admin instructions. Take 1 tablet on Mon, Tues, Thurs, Fri, and Sat    . CEPHALEXIN 500 MG PO CAPS Oral Take 500 mg by mouth 2 (two) times daily.    . CEPHALEXIN 500 MG PO CAPS Oral Take 1 capsule (500 mg total) by mouth 4 (four) times daily. 28 capsule 0    BP 157/80  Pulse 73  Temp(Src) 97.5 F (36.4 C) (Oral)  Resp 15  SpO2 93%  Physical Exam  Nursing note and vitals reviewed. Constitutional: She appears well-developed and well-nourished.  HENT:  Head: Normocephalic and atraumatic.  Mouth/Throat: Oropharynx is clear and moist.  Eyes: Conjunctivae and EOM are normal. Pupils are equal, round, and reactive to light.  Neck: Normal range of motion. Neck supple.  Cardiovascular: Normal rate, regular rhythm, normal heart sounds and intact distal pulses.  Exam reveals no gallop and no friction rub.   No murmur heard. Pulmonary/Chest: Effort normal and breath sounds normal.  Abdominal: Soft. Bowel  sounds are normal. There is no tenderness.  Musculoskeletal: Normal range of motion. She exhibits no tenderness.  Skin: Skin is warm. No rash noted.    ED Course  Procedures (including critical care time)   Date: 04/03/2011  Rate: 81  Rhythm: atrial fibrillation  QRS Axis: left  Intervals: normal  ST/T Wave abnormalities: nonspecific ST/T changes  Conduction Disutrbances:none  Narrative Interpretation:   Old EKG Reviewed: unchanged  Labs Reviewed  BASIC METABOLIC PANEL - Abnormal; Notable for the following:    Glucose, Bld 51 (*)    Creatinine, Ser 1.38 (*)    GFR calc non Af Amer 31 (*)    GFR calc Af Amer 36 (*)    All other components within normal limits  PROTIME-INR - Abnormal; Notable for the following:    Prothrombin Time 29.1 (*)    INR 2.70 (*)    All other components within normal limits  URINALYSIS, ROUTINE W REFLEX MICROSCOPIC - Abnormal; Notable for the following:    APPearance HAZY (*)    Hgb urine dipstick LARGE (*)    Protein, ur 100 (*)    Nitrite POSITIVE (*)    Leukocytes, UA MODERATE (*)    All other components within normal limits  URINE MICROSCOPIC-ADD ON - Abnormal; Notable for the following:    Squamous Epithelial / LPF FEW (*)    Bacteria, UA MANY (*)    All other components within normal limits   Dg Chest 2 View  04/03/2011  *RADIOLOGY REPORT*  Clinical Data: Fall.  Confusion.  Altered level of consciousness  CHEST - 2 VIEW  Comparison: 01/12/2011  Findings: There is a mild rotational artifact.  Moderate cardiac enlargement is noted.  Small to moderate bilateral pleural effusions and pulmonary venous congestion noted.   There is no airspace consolidation.  IMPRESSION:  1.  Suspect mild CHF. 2.  No acute cardiopulmonary abnormalities.  Original Report Authenticated By: Rosealee Albee, M.D.   Ct Head Wo Contrast  04/03/2011  *RADIOLOGY REPORT*  Clinical Data: Fall.  Headaches.  Weakness.  Diabetes.  Dementia.  CT HEAD WITHOUT CONTRAST  Technique:   Contiguous axial images were obtained from the base of the skull through the vertex without contrast.  Comparison: 03/22/2011  Findings: The brain stem, cerebellum, cerebral peduncles, thalami, basal ganglia, basilar cisterns,  and ventricular system appear unremarkable.  Periventricular and corona radiata white matter hypodensities are most compatible with chronic ischemic microvascular white matter disease.  Age appropriate atrophy noted.  No intracranial hemorrhage, mass lesion, or acute infarction is identified.  This minimal frothy material in the left sphenoid sinus, along with mild chronic ethmoid sinusitis.  IMPRESSION:  1. Periventricular and corona radiata white matter hypodensities are most compatible with chronic ischemic microvascular white matter disease. 2.  Mild chronic ethmoid and sphenoid sinusitis. 3.  Age appropriate atrophy.  Original Report Authenticated By: Dellia Cloud, M.D.     1. Fall   2. Dementia   3. UTI (lower urinary tract infection)   4. Hypoglycemia       MDM  Fall with no injury. Imaging negative. Patient with urinary tract infection. Received a dose of IV Rocephin emergency department. We discharged home with Keflex. Also found to have mild hypo-glycemia. Dextrose was given. Return and fall precautions provided        Dayton Bailiff, MD 04/03/11 1556

## 2011-04-03 NOTE — Progress Notes (Signed)
CM called [facility] to speak with RN about pt returning to facility Issue resolved with RN. Report given to facility RN. Pt to return to facility

## 2011-04-03 NOTE — Discharge Instructions (Signed)
Fall Prevention, Elderly Falls are the leading cause of injuries, accidents, and accidental deaths in people over the age of 73. Falling is a real threat to your ability to live on your own. CAUSES   Poor eyesight or poor hearing can make you more likely to fall.   Illnesses and physical conditions can affect your strength and balance.   Poor lighting, throw rugs and pets in your home can make you more likely to trip or slip.   The side effects of some medicines can upset your balance and lead to falling. These include medicines for depression, sleep problems, high blood pressure, diabetes, and heart conditions.  PREVENTION  Be sure your home is as safe as possible. Here are some tips:  Wear shoes with non-skid soles (not house slippers).   Be sure your home and outside area are well lit.   Use night lights throughout your house, including hallways and stairways.   Remove clutter and clean up spills on floors and walkways.   Remove throw rugs or fasten them to the floor with carpet tape. Tack down carpet edges.   Do not place electrical cords across pathways.   Install grab bars in your bathtub, shower, and toilet area. Towel bars should not be used as a grab bar.   Install handrails on both sides of stairways.   Do not climb on stools or stepladders. Get someone else to help with jobs that require climbing.   Do not wax your floors at all, or use a non-skid wax.   Repair uneven or unsafe sidewalks, walkways or stairs.   Keep frequently used items within reach.   Be aware of pets so you do not trip.  Get regular check-ups from your doctor, and take good care of yourself:  Have your eyes checked every year for vision changes, cataracts, glaucoma, and other eye problems. Wear eyeglasses as directed.   Have your hearing checked every 2 years, or anytime you or others think that you cannot hear well. Use hearing aids as directed.   See your caregiver if you have foot pain or  corns. Sore feet can contribute to falls.   Let your caregiver know if a medicine is making you feel dizzy or making you lose your balance.   Use a cane, walker, or wheelchair as directed. Use walker or wheelchair brakes when getting in and out.   When you get up from bed, sit on the side of the bed for 1 to 2 minutes before you stand up. This will give your blood pressure time to adjust, and you will feel less dizzy.   If you need to go to the bathroom often, consider using a bedside commode.  Keep your body in good shape:  Get regular exercise, especially walking.   Do exercises to strengthen the muscles you use for walking and lifting.   Do not smoke.   Minimize use of alcohol.  SEEK IMMEDIATE MEDICAL CARE IF:   You feel dizzy, weak, or unsteady on your feet.   You feel confused.   You fall.  Document Released: 01/08/2005 Document Revised: 12/28/2010 Document Reviewed: 07/05/2006 [facility] Patient Information 2012 [place], [place].  Urinary Tract Infection Infections of the urinary tract can start in several places. A bladder infection (cystitis), a kidney infection (pyelonephritis), and a prostate infection (prostatitis) are different types of urinary tract infections (UTIs). They usually get better if treated with medicines (antibiotics) that kill germs. Take all the medicine until it is gone. You  or your child may feel better in a few days, but TAKE ALL MEDICINE or the infection may not respond and may become more difficult to treat. HOME CARE INSTRUCTIONS   Drink enough water and fluids to keep the urine clear or pale yellow. Cranberry juice is especially recommended, in addition to large amounts of water.   Avoid caffeine, tea, and carbonated beverages. They tend to irritate the bladder.   Alcohol may irritate the prostate.   Only take over-the-counter or prescription medicines for pain, discomfort, or fever as directed by your caregiver.  To prevent further  infections:  Empty the bladder often. Avoid holding urine for long periods of time.   After a bowel movement, women should cleanse from front to back. Use each tissue only once.   Empty the bladder before and after sexual intercourse.  FINDING OUT THE RESULTS OF YOUR TEST Not all test results are available during your visit. If your or your child's test results are not back during the visit, make an appointment with your caregiver to find out the results. Do not assume everything is normal if you have not heard from your caregiver or the medical facility. It is important for you to follow up on all test results. SEEK MEDICAL CARE IF:   There is back pain.   Your baby is older than 3 months with a rectal temperature of 100.5 F (38.1 C) or higher for more than 1 day.   Your or your child's problems (symptoms) are no better in 3 days. Return sooner if you or your child is getting worse.  SEEK IMMEDIATE MEDICAL CARE IF:   There is severe back pain or lower abdominal pain.   You or your child develops chills.   You have a fever.   Your baby is older than 3 months with a rectal temperature of 102 F (38.9 C) or higher.   Your baby is 45 months old or younger with a rectal temperature of 100.4 F (38 C) or higher.   There is nausea or vomiting.   There is continued burning or discomfort with urination.  MAKE SURE YOU:   Understand these instructions.   Will watch your condition.   Will get help right away if you are not doing well or get worse.  Document Released: 10/18/2004 Document Revised: 12/28/2010 Document Reviewed: 05/23/2006 [facility] Patient Information 2012 [place], [place].

## 2011-04-03 NOTE — ED Notes (Signed)
[facility] called for transportation back to [facility] 

## 2011-04-03 NOTE — ED Notes (Signed)
A call placed to [facility] and attempted to give a report. Informed by staff at [facility] that a nurse would have to come and evaluate the patient prior to her coming back to their facility. Explained to [facility] staff that the patient was discharged. Informed by [facility] staff that they would call us.

## 2011-05-07 ENCOUNTER — Encounter (HOSPITAL_COMMUNITY): Payer: Self-pay | Admitting: Emergency Medicine

## 2011-05-07 ENCOUNTER — Emergency Department (HOSPITAL_COMMUNITY)
Admission: EM | Admit: 2011-05-07 | Discharge: 2011-05-07 | Disposition: A | Discharge status: Skilled Nursing Facility | Payer: Medicare Other | Attending: Emergency Medicine | Admitting: Emergency Medicine

## 2011-05-07 ENCOUNTER — Emergency Department (HOSPITAL_COMMUNITY): Payer: Medicare Other

## 2011-05-07 DIAGNOSIS — Z85038 Personal history of other malignant neoplasm of large intestine: Secondary | ICD-10-CM | POA: Insufficient documentation

## 2011-05-07 DIAGNOSIS — S0003XA Contusion of scalp, initial encounter: Secondary | ICD-10-CM | POA: Insufficient documentation

## 2011-05-07 DIAGNOSIS — E7800 Pure hypercholesterolemia, unspecified: Secondary | ICD-10-CM | POA: Insufficient documentation

## 2011-05-07 DIAGNOSIS — I10 Essential (primary) hypertension: Secondary | ICD-10-CM | POA: Insufficient documentation

## 2011-05-07 DIAGNOSIS — Z7901 Long term (current) use of anticoagulants: Secondary | ICD-10-CM | POA: Insufficient documentation

## 2011-05-07 DIAGNOSIS — N390 Urinary tract infection, site not specified: Secondary | ICD-10-CM | POA: Insufficient documentation

## 2011-05-07 DIAGNOSIS — E079 Disorder of thyroid, unspecified: Secondary | ICD-10-CM

## 2011-05-07 DIAGNOSIS — S0083XA Contusion of other part of head, initial encounter: Secondary | ICD-10-CM

## 2011-05-07 DIAGNOSIS — F068 Other specified mental disorders due to known physiological condition: Secondary | ICD-10-CM | POA: Insufficient documentation

## 2011-05-07 DIAGNOSIS — J449 Chronic obstructive pulmonary disease, unspecified: Secondary | ICD-10-CM | POA: Insufficient documentation

## 2011-05-07 DIAGNOSIS — R791 Abnormal coagulation profile: Secondary | ICD-10-CM

## 2011-05-07 DIAGNOSIS — R51 Headache: Secondary | ICD-10-CM | POA: Insufficient documentation

## 2011-05-07 DIAGNOSIS — Z79899 Other long term (current) drug therapy: Secondary | ICD-10-CM | POA: Insufficient documentation

## 2011-05-07 DIAGNOSIS — S51009A Unspecified open wound of unspecified elbow, initial encounter: Secondary | ICD-10-CM | POA: Insufficient documentation

## 2011-05-07 DIAGNOSIS — S5000XA Contusion of unspecified elbow, initial encounter: Secondary | ICD-10-CM | POA: Insufficient documentation

## 2011-05-07 DIAGNOSIS — I4891 Unspecified atrial fibrillation: Secondary | ICD-10-CM | POA: Insufficient documentation

## 2011-05-07 DIAGNOSIS — R42 Dizziness and giddiness: Secondary | ICD-10-CM | POA: Insufficient documentation

## 2011-05-07 DIAGNOSIS — J4489 Other specified chronic obstructive pulmonary disease: Secondary | ICD-10-CM | POA: Insufficient documentation

## 2011-05-07 DIAGNOSIS — Y9210 Unspecified residential institution as the place of occurrence of the external cause: Secondary | ICD-10-CM | POA: Insufficient documentation

## 2011-05-07 DIAGNOSIS — S1093XA Contusion of unspecified part of neck, initial encounter: Secondary | ICD-10-CM | POA: Insufficient documentation

## 2011-05-07 DIAGNOSIS — E119 Type 2 diabetes mellitus without complications: Secondary | ICD-10-CM | POA: Insufficient documentation

## 2011-05-07 DIAGNOSIS — W19XXXA Unspecified fall, initial encounter: Secondary | ICD-10-CM | POA: Insufficient documentation

## 2011-05-07 LAB — URINALYSIS, ROUTINE W REFLEX MICROSCOPIC
Bilirubin Urine: NEGATIVE
Specific Gravity, Urine: 1.009 (ref 1.005–1.030)
Urobilinogen, UA: 0.2 mg/dL (ref 0.0–1.0)

## 2011-05-07 LAB — CBC
HCT: 37.8 % (ref 36.0–46.0)
MCH: 29.4 pg (ref 26.0–34.0)
MCHC: 33.9 g/dL (ref 30.0–36.0)
MCV: 86.7 fL (ref 78.0–100.0)
RDW: 15.4 % (ref 11.5–15.5)

## 2011-05-07 LAB — URINE MICROSCOPIC-ADD ON

## 2011-05-07 MED ORDER — CEPHALEXIN 500 MG PO CAPS: 500.0000 mg | ORAL_CAPSULE | Freq: Two times a day (BID) | ORAL | Status: AC

## 2011-05-07 MED ORDER — CEPHALEXIN 500 MG PO CAPS
500.0000 mg | ORAL_CAPSULE | Freq: Once | ORAL | Status: AC
  Administered 2011-05-07: 500 mg via ORAL
  Filled 2011-05-07: qty 1

## 2011-05-07 NOTE — Discharge Instructions (Signed)
The patient's INR level was elevated today. Please hold her dose on Monday. Patient's CAT scan did not show any acute injuries, but did show a mass in her thyroid that will need followed up. Please discuss with patient's primary care provider regarding her Coumadin dosing. It is noted that she has history of falls and has been seen in the emergency department in the last month for another fall. Coumadin may not be safe for her if she is having problems with balance. Patient was noted to have a urinary tract infection. Please start her on the antibiotics as prescribed.

## 2011-05-07 NOTE — ED Notes (Signed)
[id number]<AV> Expected date:05/07/11<BR> Expected time: 1:29 AM<BR> Means of arrival:Ambulance<BR> Comments:<BR> Rm 10, Fall, head injury. Takes coumadin

## 2011-05-07 NOTE — ED Notes (Signed)
To ED from nursing home via EMS, NH staff found pt on floor, ?LOC, no vomiting, pt alert to baseline per staff, pt c/o dizzyness and HA, has hematoma above right eye and skin tear to right elbow, denies neck/back pain and hip pain, NAD

## 2011-05-07 NOTE — ED Provider Notes (Signed)
History     CSN: [id number]  Arrival date & time 05/07/11  0142   First MD Initiated Contact with Patient 05/07/11 0202      Chief Complaint  Patient presents with  . Fall    (Consider location/radiation/quality/duration/timing/severity/associated sxs/prior treatment) HPI 78 year old female presents to emergency department with reported fall. Fall was not witnessed. Patient resides in nursing home, has history of dementia. Patient was found on the floor by nursing home staff. Patient is at her baseline per nursing home staff. Patient with bruising to right lateral upper eye region as well as skin tear and bruising to right elbow. Patient without complaints at this time. Patient is on Coumadin for atrial fibrillation Past Medical History  Diagnosis Date  . Diabetes mellitus   . Hypertension   . Atrial fibrillation   . Dementia   . Cancer 1970's; 2005    colon  . Hypercholesterolemia   . Heart murmur   . UTI (lower urinary tract infection)     "it does reoccur"  . Pneumonia   . COPD (chronic obstructive pulmonary disease) 01/12/11  . Bronchitis 2007    Past Surgical History  Procedure Date  . Colon surgery   . Colon resection 1970's; 2005  . Cataract extraction     "one eye; don't know which; don't know if they put lens in"    No family history on file.  History  Substance Use Topics  . Smoking status: Never Smoker   . Smokeless tobacco: Never Used  . Alcohol Use: Yes     "used to have spritzers when she was young"    OB History    Grav Para Term Preterm Abortions TAB SAB Ect Mult Living                  Review of Systems  Unable to perform ROS: Dementia    Allergies  Codeine  Home Medications   Current Outpatient Rx  Name Route Sig Dispense Refill  . VITAMIN D 1000 UNITS PO TABS Oral Take 2,000 Units by mouth daily.      Marland Kitchen DIGOXIN 0.125 MG PO TABS Oral Take 125 mcg by mouth every Monday, Wednesday, and Friday.     . ENSURE PO LIQD Oral Take 237  mLs by mouth 2 (two) times daily between meals.      Di Kindle SULFATE 325 (65 FE) MG PO TABS Oral Take 1 tablet (325 mg total) by mouth 3 (three) times daily with meals.    . FUROSEMIDE 80 MG PO TABS Oral Take 80 mg by mouth every evening.      Marland Kitchen GLYBURIDE 2.5 MG PO TABS Oral Take 2.5 mg by mouth See admin instructions. Take 1 tablet at 8am and 1 tablet at 5 pm    . METOPROLOL SUCCINATE ER 25 MG PO TB24 Oral Take 25 mg by mouth 2 (two) times daily.      Marland Kitchen MIRTAZAPINE 15 MG PO TABS Oral Take 15 mg by mouth at bedtime. For depression    . PANTOPRAZOLE SODIUM 40 MG PO TBEC Oral Take 40 mg by mouth daily.      Marland Kitchen POLYETHYLENE GLYCOL 3350 PO PACK Oral Take 17 g by mouth daily.      Marland Kitchen POTASSIUM CHLORIDE 10 MEQ PO TBCR Oral Take 10 mEq by mouth 2 (two) times daily.     Marland Kitchen TRIMETHOPRIM 100 MG PO TABS Oral Take 100 mg by mouth at bedtime.    . WARFARIN SODIUM 5 MG  PO TABS Oral Take 5 mg by mouth See admin instructions. Take 1 tablet on Mon, Tues, Thurs, Fri, and Sat    . ACETAMINOPHEN 325 MG PO TABS Oral Take 650 mg by mouth 3 (three) times daily as needed. For pain. Do not exceed 4gms in 24 hrs.    . CEPHALEXIN 500 MG PO CAPS Oral Take 500 mg by mouth 2 (two) times daily.      BP 158/71  Pulse 86  Temp(Src) 97.8 F (36.6 C) (Oral)  Resp 16  SpO2 98%  Physical Exam  Nursing note and vitals reviewed. Constitutional: She appears well-developed and well-nourished.       Pleasant elderly lady in no acute distress appears younger than her age, confused  HENT:  Head: Normocephalic.  Right Ear: External ear normal.  Left Ear: External ear normal.  Nose: Nose normal.  Mouth/Throat: Oropharynx is clear and moist.       Mild contusion to right supraorbital ridge laterally  Eyes: Conjunctivae and EOM are normal. Pupils are equal, round, and reactive to light.  Neck: Normal range of motion. Neck supple. No JVD present. No tracheal deviation present. No thyromegaly present.  Cardiovascular: Normal rate,  regular rhythm, normal heart sounds and intact distal pulses.  Exam reveals no gallop and no friction rub.   No murmur heard. Pulmonary/Chest: Effort normal and breath sounds normal. No stridor. No respiratory distress. She has no wheezes. She has no rales. She exhibits no tenderness.  Abdominal: Soft. Bowel sounds are normal. She exhibits no distension and no mass. There is no tenderness. There is no rebound and no guarding.  Musculoskeletal: Normal range of motion. She exhibits edema. She exhibits no tenderness.       Contusion with skin abrasion right elbow  Lymphadenopathy:    She has no cervical adenopathy.  Neurological: She has normal reflexes. No cranial nerve deficit. She exhibits normal muscle tone. Coordination normal.       Patient is oriented to self. She thinks she is in [place] and is supposed to be going to a halloween party  Skin: Skin is dry. No rash noted. No erythema. No pallor.  Psychiatric: She has a normal mood and affect. Her behavior is normal. Judgment and thought content normal.    ED Course  Procedures (including critical care time)  Labs Reviewed  PROTIME-INR - Abnormal; Notable for the following:    Prothrombin Time 39.4 (*)    INR 3.98 (*)    All other components within normal limits  CBC - Abnormal; Notable for the following:    WBC 3.7 (*)    Platelets 148 (*)    All other components within normal limits   Ct Head Wo Contrast  05/07/2011  *RADIOLOGY REPORT*  Clinical Data:  Found on floor; headache and dizziness.  Question of loss of consciousness.  Concern for cervical spine injury.  CT HEAD WITHOUT CONTRAST AND CT CERVICAL SPINE WITHOUT CONTRAST  Technique:  Multidetector CT imaging of the head and cervical spine was performed following the standard protocol without intravenous contrast.  Multiplanar CT image reconstructions of the cervical spine were also generated.  Comparison: CT of the head performed 04/03/2011  CT HEAD  Findings: There is no  evidence of acute infarction, mass lesion, or intra- or extra-axial hemorrhage on CT.  Prominence of the ventricles and sulci reflects moderately severe age-appropriate cortical volume loss.  Cerebellar atrophy is noted. Scattered periventricular and subcortical white matter change likely reflects small vessel ischemic microangiopathy.  The brainstem and fourth ventricle are within normal limits.  The basal ganglia are unremarkable in appearance.  The cerebral hemispheres demonstrate grossly normal gray-white differentiation. No mass effect or midline shift is seen.  There is no evidence of fracture; visualized osseous structures are unremarkable in appearance.  The orbits are within normal limits. There is partial opacification of the sphenoid sinus; the remaining paranasal sinuses and mastoid air cells are well-aerated.  No significant soft tissue abnormalities are seen.  IMPRESSION:  1.  No evidence of traumatic intracranial injury or fracture. 2.  Moderately severe age-appropriate cortical volume loss and scattered small vessel ischemic microangiopathy. 3.  Partial opacification of the sphenoid sinus.  CT CERVICAL SPINE  Findings: There is no evidence of fracture or subluxation.  Mild intervertebral disc space narrowing is noted at C5-C6, with associated anterior and posterior disc osteophyte complexes.  Mild degenerative change is noted at the dens.  Vertebral bodies demonstrate normal height and alignment.  Prevertebral soft tissues are within normal limits.  There is a 5.1 x 2.7 cm left thyroid mass, with heterogeneous components; this raises concern for malignancy.  The visualized lung apices are clear.  Calcification is noted at the carotid bifurcations bilaterally.  IMPRESSION:  1.  No evidence of fracture or subluxation along the cervical spine. 2.  Mild degenerative change along the cervical spine. 3.  Calcification at the carotid bifurcations bilaterally. 4.  5.1 x 2.7 cm left thyroid mass, with  heterogeneous components; this raises concern for malignancy.  Further evaluation may be considered as deemed clinically appropriate.  Original Report Authenticated By: Tonia Ghent, M.D.   Ct Cervical Spine Wo Contrast  05/07/2011  *RADIOLOGY REPORT*  Clinical Data:  Found on floor; headache and dizziness.  Question of loss of consciousness.  Concern for cervical spine injury.  CT HEAD WITHOUT CONTRAST AND CT CERVICAL SPINE WITHOUT CONTRAST  Technique:  Multidetector CT imaging of the head and cervical spine was performed following the standard protocol without intravenous contrast.  Multiplanar CT image reconstructions of the cervical spine were also generated.  Comparison: CT of the head performed 04/03/2011  CT HEAD  Findings: There is no evidence of acute infarction, mass lesion, or intra- or extra-axial hemorrhage on CT.  Prominence of the ventricles and sulci reflects moderately severe age-appropriate cortical volume loss.  Cerebellar atrophy is noted. Scattered periventricular and subcortical white matter change likely reflects small vessel ischemic microangiopathy.  The brainstem and fourth ventricle are within normal limits.  The basal ganglia are unremarkable in appearance.  The cerebral hemispheres demonstrate grossly normal gray-white differentiation. No mass effect or midline shift is seen.  There is no evidence of fracture; visualized osseous structures are unremarkable in appearance.  The orbits are within normal limits. There is partial opacification of the sphenoid sinus; the remaining paranasal sinuses and mastoid air cells are well-aerated.  No significant soft tissue abnormalities are seen.  IMPRESSION:  1.  No evidence of traumatic intracranial injury or fracture. 2.  Moderately severe age-appropriate cortical volume loss and scattered small vessel ischemic microangiopathy. 3.  Partial opacification of the sphenoid sinus.  CT CERVICAL SPINE  Findings: There is no evidence of fracture or  subluxation.  Mild intervertebral disc space narrowing is noted at C5-C6, with associated anterior and posterior disc osteophyte complexes.  Mild degenerative change is noted at the dens.  Vertebral bodies demonstrate normal height and alignment.  Prevertebral soft tissues are within normal limits.  There is a 5.1 x 2.7 cm left thyroid  mass, with heterogeneous components; this raises concern for malignancy.  The visualized lung apices are clear.  Calcification is noted at the carotid bifurcations bilaterally.  IMPRESSION:  1.  No evidence of fracture or subluxation along the cervical spine. 2.  Mild degenerative change along the cervical spine. 3.  Calcification at the carotid bifurcations bilaterally. 4.  5.1 x 2.7 cm left thyroid mass, with heterogeneous components; this raises concern for malignancy.  Further evaluation may be considered as deemed clinically appropriate.  Original Report Authenticated By: Tonia Ghent, M.D.     1. Fall   2. Contusion of face   3. Supratherapeutic INR   4. Urinary tract infection   5. Thyroid mass       MDM  78 year old female status post fall. CT scans without acute injury. Thyroid mass noted and will need followup. Patient is supratherapeutic on her Coumadin. Patient demented, but does not appear to have any focal neurologic deficits at this time. We'll also check urine as patient had urinary tract infection surrounding her last fall. Will have nursing home staff discuss with patient's Dr. need for continued Coumadin given her age and frequent falls. Her Coumadin will need to be held given her elevated levels.        Olivia Mackie, MD 05/07/11 [phone]

## 2011-05-09 LAB — URINE CULTURE

## 2011-05-10 NOTE — ED Notes (Signed)
Patient is from a nursing home.

## 2011-11-30 ENCOUNTER — Emergency Department (HOSPITAL_COMMUNITY)
Admission: EM | Admit: 2011-11-30 | Discharge: 2011-11-30 | Disposition: A | Discharge status: Home / Self Care | Payer: Medicare Other | Attending: Emergency Medicine | Admitting: Emergency Medicine

## 2011-11-30 ENCOUNTER — Encounter (HOSPITAL_COMMUNITY): Payer: Self-pay | Admitting: Emergency Medicine

## 2011-11-30 ENCOUNTER — Emergency Department (HOSPITAL_COMMUNITY): Payer: Medicare Other

## 2011-11-30 DIAGNOSIS — Z8701 Personal history of pneumonia (recurrent): Secondary | ICD-10-CM | POA: Insufficient documentation

## 2011-11-30 DIAGNOSIS — Z8679 Personal history of other diseases of the circulatory system: Secondary | ICD-10-CM | POA: Insufficient documentation

## 2011-11-30 DIAGNOSIS — Z79899 Other long term (current) drug therapy: Secondary | ICD-10-CM | POA: Insufficient documentation

## 2011-11-30 DIAGNOSIS — S32519A Fracture of superior rim of unspecified pubis, initial encounter for closed fracture: Secondary | ICD-10-CM

## 2011-11-30 DIAGNOSIS — Z7982 Long term (current) use of aspirin: Secondary | ICD-10-CM | POA: Insufficient documentation

## 2011-11-30 DIAGNOSIS — F0390 Unspecified dementia without behavioral disturbance: Secondary | ICD-10-CM | POA: Insufficient documentation

## 2011-11-30 DIAGNOSIS — Y9389 Activity, other specified: Secondary | ICD-10-CM | POA: Insufficient documentation

## 2011-11-30 DIAGNOSIS — E785 Hyperlipidemia, unspecified: Secondary | ICD-10-CM | POA: Insufficient documentation

## 2011-11-30 DIAGNOSIS — N390 Urinary tract infection, site not specified: Secondary | ICD-10-CM | POA: Insufficient documentation

## 2011-11-30 DIAGNOSIS — Z85038 Personal history of other malignant neoplasm of large intestine: Secondary | ICD-10-CM | POA: Insufficient documentation

## 2011-11-30 DIAGNOSIS — W19XXXA Unspecified fall, initial encounter: Secondary | ICD-10-CM

## 2011-11-30 DIAGNOSIS — E119 Type 2 diabetes mellitus without complications: Secondary | ICD-10-CM | POA: Insufficient documentation

## 2011-11-30 DIAGNOSIS — Y929 Unspecified place or not applicable: Secondary | ICD-10-CM | POA: Insufficient documentation

## 2011-11-30 DIAGNOSIS — J4489 Other specified chronic obstructive pulmonary disease: Secondary | ICD-10-CM | POA: Insufficient documentation

## 2011-11-30 DIAGNOSIS — J449 Chronic obstructive pulmonary disease, unspecified: Secondary | ICD-10-CM | POA: Insufficient documentation

## 2011-11-30 DIAGNOSIS — R296 Repeated falls: Secondary | ICD-10-CM | POA: Insufficient documentation

## 2011-11-30 DIAGNOSIS — S0990XA Unspecified injury of head, initial encounter: Secondary | ICD-10-CM

## 2011-11-30 DIAGNOSIS — I10 Essential (primary) hypertension: Secondary | ICD-10-CM | POA: Insufficient documentation

## 2011-11-30 DIAGNOSIS — S32509A Unspecified fracture of unspecified pubis, initial encounter for closed fracture: Secondary | ICD-10-CM | POA: Insufficient documentation

## 2011-11-30 LAB — URINALYSIS, ROUTINE W REFLEX MICROSCOPIC
Glucose, UA: NEGATIVE mg/dL
Protein, ur: >300 mg/dL — AB
Specific Gravity, Urine: 1.015 (ref 1.005–1.030)

## 2011-11-30 MED ORDER — NITROFURANTOIN MONOHYD MACRO 100 MG PO CAPS: 100.0000 mg | ORAL_CAPSULE | Freq: Two times a day (BID) | ORAL | Status: AC

## 2011-11-30 MED ORDER — NITROFURANTOIN MONOHYD MACRO 100 MG PO CAPS
100.0000 mg | ORAL_CAPSULE | Freq: Once | ORAL | Status: AC
  Administered 2011-11-30: 100 mg via ORAL
  Filled 2011-11-30: qty 1

## 2011-11-30 NOTE — ED Notes (Signed)
Patient transported to CT 

## 2011-11-30 NOTE — ED Notes (Addendum)
Patient is now able to bend right knee pillow placed under right leg for support.  Now constantly howlering somebody help me, talks about her leg and point to right leg.

## 2011-11-30 NOTE — ED Notes (Addendum)
Assited to bedpan hematuria noted foul odor. MD made aware no further instructions given.
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
## 2011-11-30 NOTE — ED Notes (Signed)
LKG:MW10<UV> Expected date:11/30/11<BR> Expected time: 8:57 AM<BR> Means of arrival:Ambulance<BR> Comments:<BR> Fall, head pain
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
## 2011-11-30 NOTE — ED Notes (Signed)
Patient transported to X-ray 

## 2011-11-30 NOTE — ED Notes (Signed)
Patient is able to be re-directed at times however, does have dementia.

## 2011-12-01 MED ORDER — NITROFURANTOIN MONOHYD MACRO 100 MG PO CAPS: 100.0000 mg | ORAL_CAPSULE | Freq: Two times a day (BID) | ORAL | Status: AC

## 2011-12-01 NOTE — ED Notes (Signed)
Opened the chart to follow up on prescription from yesterday, family states pt did not receive a prescription, prescription faxed to Nursing home  [phone].

## 2011-12-03 LAB — URINE CULTURE: Colony Count: >=100000

## 2011-12-04 NOTE — ED Notes (Signed)
+   urine Patient treated with Macrobid-sensitive to same-chart appended per protocol MD. 

## 2012-01-23 DEATH — deceased

## 2013-11-14 IMAGING — CT CT HEAD W/O CM
2 series · 15 of 30 positions shown, 17 images · non-contrast
Comparison: 10/14/2009.

CLINICAL DATA: Fall.  Confusion.

CT HEAD WITHOUT CONTRAST
TECHNIQUE: Contiguous axial images were obtained from the base of
the skull through the vertex without contrast.

[Series 2: head w/o · axial · non-contrast · 0.43mm/px · z∈[-110,-15]mm · 7 of 27 slices shown, 9 images]
[im 4/27  brain]
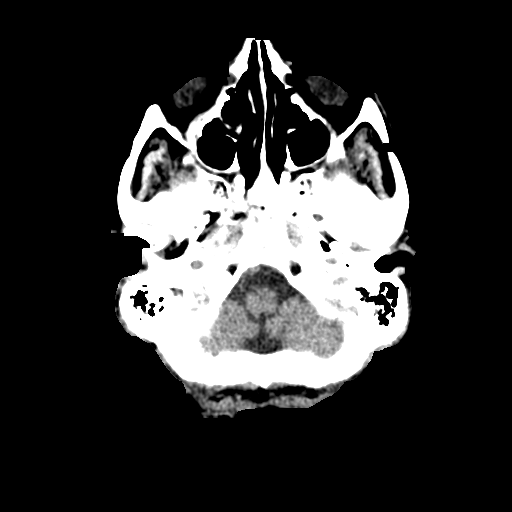
[im 4/27  bone]
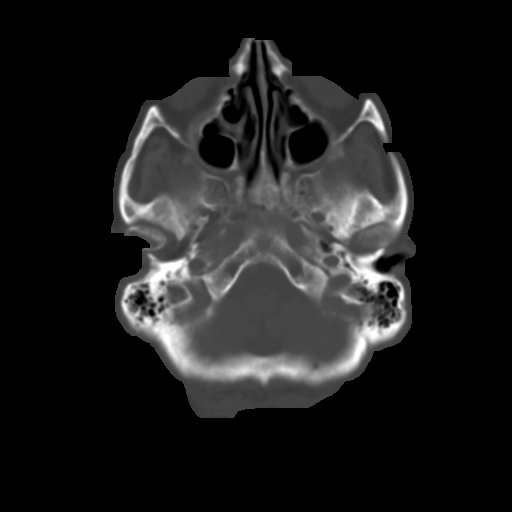
[im 7/27  brain]
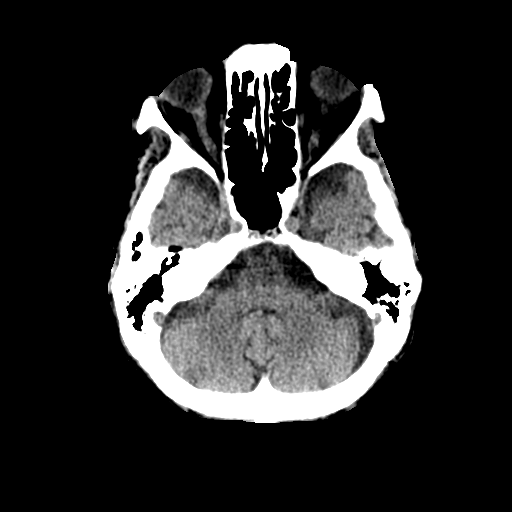
[im 10/27  brain]
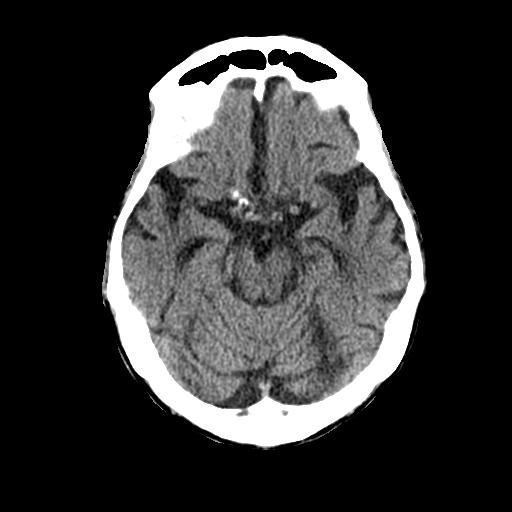
[im 14/27  brain]
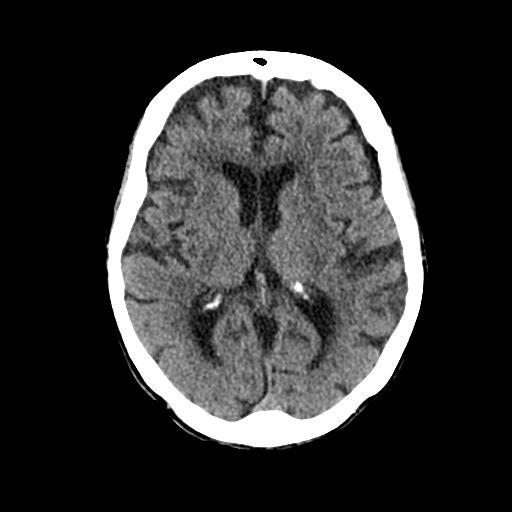
[im 17/27  brain]
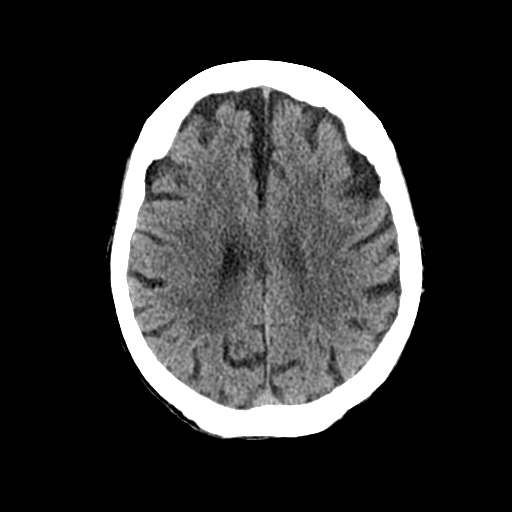
[im 17/27  bone]
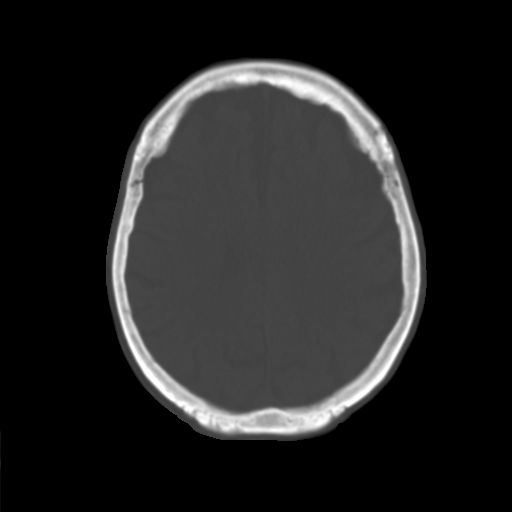
[im 20/27  brain]
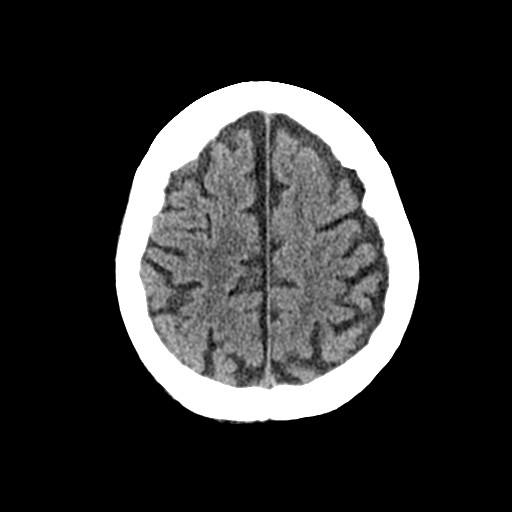
[im 23/27  brain]
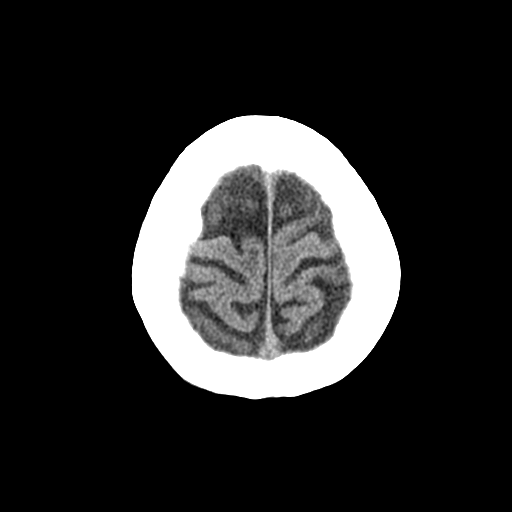

[Series 3: bone windows · axial · 0.43mm/px · z∈[-113,-9]mm · 8 of 66 slices shown]
[im 7/66  bone]
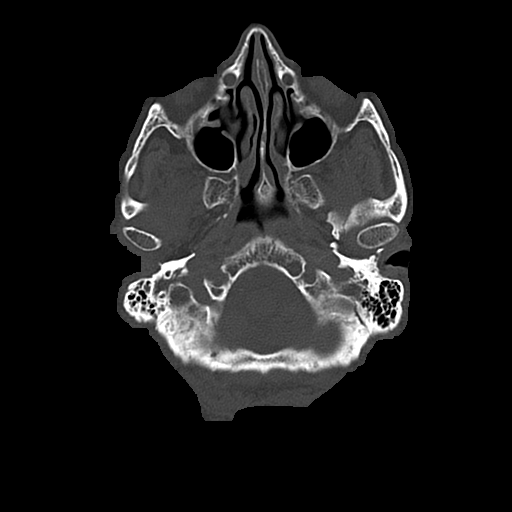
[im 14/66  bone]
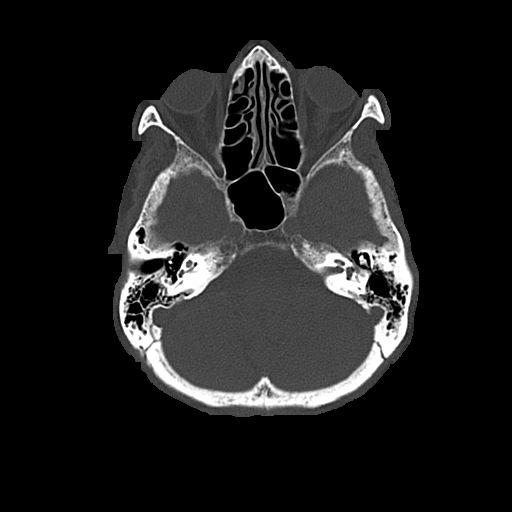
[im 20/66  bone]
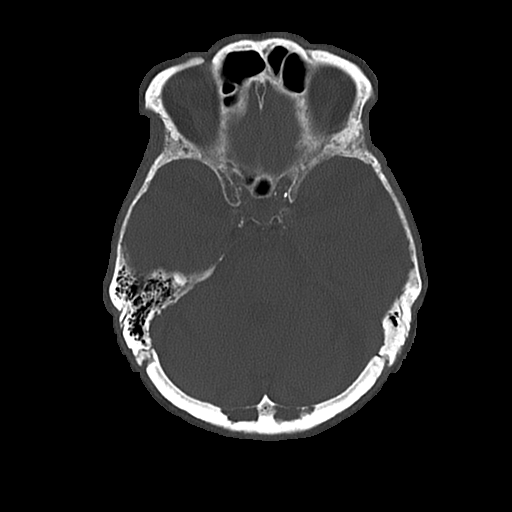
[im 30/66  bone]
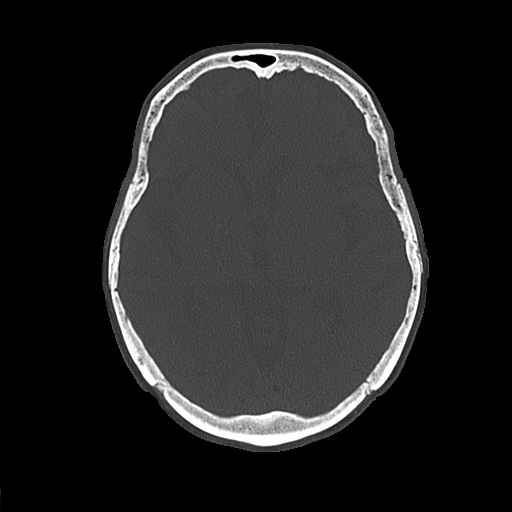
[im 36/66  bone]
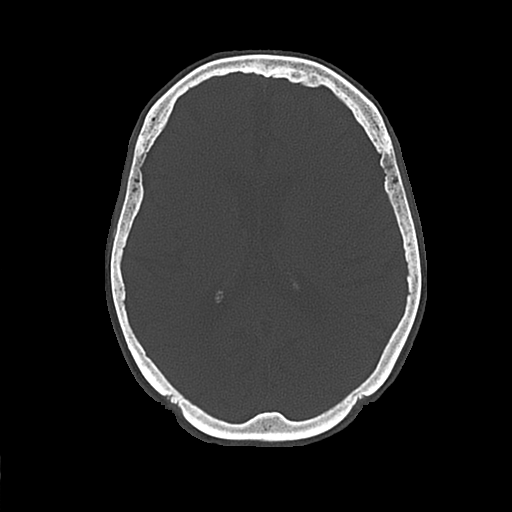
[im 46/66  bone]
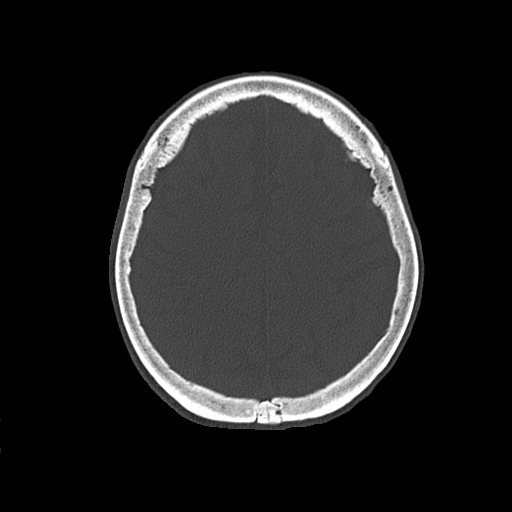
[im 53/66  bone]
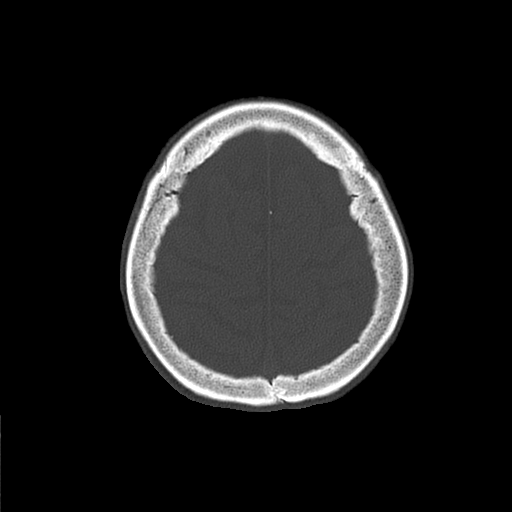
[im 59/66  bone]
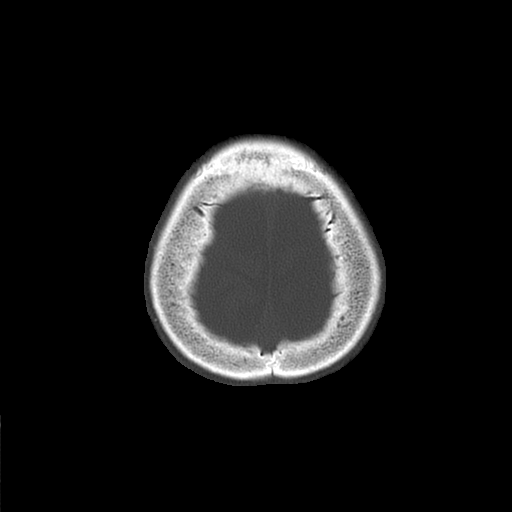

[15 of 30 positions shown; findings below may reference images not displayed]

FINDINGS: No skull fracture.  Lucencies within the calvarium
unchanged.  Etiology indeterminate.

No intracranial hemorrhage.

No CT evidence of large acute infarct.

Small vessel disease type changes.

Global atrophy without hydrocephalus. Vascular calcifications.
IMPRESSION: No skull fracture or intracranial hemorrhage.  Please see above.
